# Patient Record
Sex: Female | Born: 1975 | Race: White | Hispanic: No | Marital: Single | Smoking: Former smoker
Health system: Southern US, Community
[De-identification: ages and names within clinical notes are randomized; demographics above are authoritative.]

## PROBLEM LIST (undated history)

## (undated) DIAGNOSIS — E559 Vitamin D deficiency, unspecified: Secondary | ICD-10-CM

## (undated) DIAGNOSIS — F1210 Cannabis abuse, uncomplicated: Secondary | ICD-10-CM

## (undated) DIAGNOSIS — F17200 Nicotine dependence, unspecified, uncomplicated: Secondary | ICD-10-CM

## (undated) DIAGNOSIS — S73015A Posterior dislocation of left hip, initial encounter: Secondary | ICD-10-CM

## (undated) HISTORY — PX: TUBAL LIGATION: SHX77

## (undated) HISTORY — PX: BREAST SURGERY: SHX581

## (undated) HISTORY — PX: FRACTURE SURGERY: SHX138

---
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
## 2004-01-21 ENCOUNTER — Emergency Department (HOSPITAL_COMMUNITY): Admission: EM | Admit: 2004-01-21 | Discharge: 2004-01-21 | Payer: Self-pay | Admitting: Emergency Medicine

## 2004-10-08 ENCOUNTER — Emergency Department (HOSPITAL_COMMUNITY): Admission: EM | Admit: 2004-10-08 | Discharge: 2004-10-08 | Payer: Self-pay | Admitting: Emergency Medicine

## 2005-03-14 ENCOUNTER — Emergency Department (HOSPITAL_COMMUNITY): Admission: EM | Admit: 2005-03-14 | Discharge: 2005-03-14 | Payer: Self-pay | Admitting: Emergency Medicine

## 2005-03-17 ENCOUNTER — Emergency Department (HOSPITAL_COMMUNITY): Admission: EM | Admit: 2005-03-17 | Discharge: 2005-03-17 | Payer: Self-pay | Admitting: Emergency Medicine

## 2005-03-19 ENCOUNTER — Emergency Department (HOSPITAL_COMMUNITY): Admission: EM | Admit: 2005-03-19 | Discharge: 2005-03-19 | Payer: Self-pay | Admitting: Family Medicine

## 2006-01-28 ENCOUNTER — Emergency Department (HOSPITAL_COMMUNITY): Admission: EM | Admit: 2006-01-28 | Discharge: 2006-01-28 | Payer: Self-pay | Admitting: Emergency Medicine

## 2006-08-28 ENCOUNTER — Emergency Department (HOSPITAL_COMMUNITY): Admission: EM | Admit: 2006-08-28 | Discharge: 2006-08-28 | Payer: Self-pay | Admitting: Emergency Medicine

## 2007-06-11 ENCOUNTER — Emergency Department: Payer: Self-pay | Admitting: Emergency Medicine

## 2007-07-17 ENCOUNTER — Emergency Department: Payer: Self-pay | Admitting: Internal Medicine

## 2007-09-25 ENCOUNTER — Emergency Department: Payer: Self-pay | Admitting: Emergency Medicine

## 2008-05-29 ENCOUNTER — Emergency Department: Payer: Self-pay | Admitting: Emergency Medicine

## 2010-09-21 ENCOUNTER — Emergency Department: Payer: Self-pay | Admitting: Emergency Medicine

## 2011-03-29 ENCOUNTER — Emergency Department (HOSPITAL_COMMUNITY)
Admission: EM | Admit: 2011-03-29 | Discharge: 2011-03-29 | Disposition: A | Discharge status: Home / Self Care | Payer: Self-pay | Attending: Emergency Medicine | Admitting: Emergency Medicine

## 2011-03-29 DIAGNOSIS — R109 Unspecified abdominal pain: Secondary | ICD-10-CM | POA: Insufficient documentation

## 2011-03-29 LAB — COMPREHENSIVE METABOLIC PANEL
AST: 9 U/L (ref 0–37)
BUN: 11 mg/dL (ref 6–23)
CO2: 28 mEq/L (ref 19–32)
Calcium: 9.7 mg/dL (ref 8.4–10.5)
Creatinine, Ser: 0.9 mg/dL (ref 0.4–1.2)
GFR calc Af Amer: >60 mL/min (ref >60)
GFR calc non Af Amer: >60 mL/min (ref >60)
Glucose, Bld: 86 mg/dL (ref 70–99)

## 2011-03-29 LAB — URINALYSIS, ROUTINE W REFLEX MICROSCOPIC
Nitrite: NEGATIVE
Protein, ur: NEGATIVE mg/dL
Urobilinogen, UA: 2 mg/dL — ABNORMAL HIGH (ref 0.0–1.0)

## 2011-03-29 LAB — DIFFERENTIAL
Basophils Relative: 1 % (ref 0–1)
Monocytes Absolute: 0.8 10*3/uL (ref 0.1–1.0)
Monocytes Relative: 8 % (ref 3–12)
Neutro Abs: 5.5 10*3/uL (ref 1.7–7.7)

## 2011-03-29 LAB — CBC
Hemoglobin: 11.7 g/dL — ABNORMAL LOW (ref 12.0–15.0)
MCH: 29.9 pg (ref 26.0–34.0)
MCHC: 33.1 g/dL (ref 30.0–36.0)

## 2011-03-29 LAB — URINE MICROSCOPIC-ADD ON

## 2011-03-29 LAB — LIPASE, BLOOD: Lipase: 27 U/L (ref 11–59)

## 2011-03-29 LAB — PREGNANCY, URINE: Preg Test, Ur: NEGATIVE

## 2011-03-30 ENCOUNTER — Ambulatory Visit (HOSPITAL_COMMUNITY)
Admit: 2011-03-30 | Discharge: 2011-03-30 | Disposition: A | Discharge status: Home / Self Care | Payer: Self-pay | Source: Ambulatory Visit | Attending: Emergency Medicine | Admitting: Emergency Medicine

## 2011-03-30 DIAGNOSIS — K824 Cholesterolosis of gallbladder: Secondary | ICD-10-CM | POA: Insufficient documentation

## 2011-03-30 DIAGNOSIS — R109 Unspecified abdominal pain: Secondary | ICD-10-CM | POA: Insufficient documentation

## 2011-03-30 LAB — URINE CULTURE
Colony Count: 35000
Culture  Setup Time: 201205311820

## 2011-08-14 ENCOUNTER — Emergency Department (HOSPITAL_COMMUNITY)
Admission: EM | Admit: 2011-08-14 | Discharge: 2011-08-14 | Disposition: A | Discharge status: Home / Self Care | Payer: Self-pay | Attending: Emergency Medicine | Admitting: Emergency Medicine

## 2011-08-14 ENCOUNTER — Encounter: Payer: Self-pay | Admitting: *Deleted

## 2011-08-14 ENCOUNTER — Emergency Department (HOSPITAL_COMMUNITY): Payer: Self-pay

## 2011-08-14 DIAGNOSIS — F17200 Nicotine dependence, unspecified, uncomplicated: Secondary | ICD-10-CM | POA: Insufficient documentation

## 2011-08-14 DIAGNOSIS — Y92009 Unspecified place in unspecified non-institutional (private) residence as the place of occurrence of the external cause: Secondary | ICD-10-CM | POA: Insufficient documentation

## 2011-08-14 DIAGNOSIS — M65839 Other synovitis and tenosynovitis, unspecified forearm: Secondary | ICD-10-CM | POA: Insufficient documentation

## 2011-08-14 DIAGNOSIS — X503XXA Overexertion from repetitive movements, initial encounter: Secondary | ICD-10-CM | POA: Insufficient documentation

## 2011-08-14 DIAGNOSIS — M65849 Other synovitis and tenosynovitis, unspecified hand: Secondary | ICD-10-CM | POA: Insufficient documentation

## 2011-08-14 DIAGNOSIS — M779 Enthesopathy, unspecified: Secondary | ICD-10-CM

## 2011-08-14 LAB — URINALYSIS, ROUTINE W REFLEX MICROSCOPIC
Hgb urine dipstick: NEGATIVE
Ketones, ur: NEGATIVE mg/dL
Protein, ur: NEGATIVE mg/dL
Urobilinogen, UA: 0.2 mg/dL (ref 0.0–1.0)

## 2011-08-14 LAB — URINE MICROSCOPIC-ADD ON

## 2011-08-14 MED ORDER — DEXAMETHASONE SODIUM PHOSPHATE 4 MG/ML IJ SOLN
8.0000 mg | Freq: Once | INTRAMUSCULAR | Status: AC
  Administered 2011-08-14: 8 mg via INTRAMUSCULAR
  Filled 2011-08-14: qty 2

## 2011-08-14 MED ORDER — HYDROCODONE-ACETAMINOPHEN 5-325 MG PO TABS: 1.0000 | ORAL_TABLET | ORAL | Status: AC | PRN

## 2011-08-14 MED ORDER — DEXAMETHASONE 6 MG PO TABS: ORAL_TABLET | ORAL | Status: AC

## 2011-08-14 NOTE — ED Notes (Signed)
Pt states injured arm while working in yard on Sunday. Pt thought it would get better however the "swelling has increased".  Pt also c/o burning and frequency with urination. Urine specimen obtained and sent.

## 2011-08-14 NOTE — ED Provider Notes (Signed)
History     CSN: [id number] Arrival date & time: 08/14/2011 12:24 PM   First MD Initiated Contact with Patient 08/14/11 1324      Chief Complaint  Patient presents with  . Arm Injury    (Consider location/radiation/quality/duration/timing/severity/associated sxs/prior treatment) Patient is a 35 y.o. female presenting with arm injury. The history is provided by the patient.  Arm Injury  The incident occurred yesterday. The incident occurred at home. Injury mechanism: lifting and repetitive motion. Context: working in the yard. There is an injury to the right wrist. The pain is moderate. It is unlikely that a foreign body is present. Pertinent negatives include no chest pain, no numbness, no abdominal pain, no bowel incontinence, no nausea, no vomiting, no neck pain, no focal weakness, no seizures, no weakness and no cough. There have been no prior injuries to these areas. She is right-handed. She has been behaving normally. She has received no recent medical care.    History reviewed. No pertinent past medical history.  Past Surgical History  Procedure Date  . Tubal ligation     Family History  Problem Relation Age of Onset  . Diabetes Other     History  Substance Use Topics  . Smoking status: Current Everyday Smoker -- 1.0 packs/day    Types: Cigarettes  . Smokeless tobacco: Not on file  . Alcohol Use: Yes     occasional social drinker    OB History    Grav Para Term Preterm Abortions TAB SAB Ect Mult Living                  Review of Systems  Constitutional: Negative for activity change.       All ROS Neg except as noted in HPI  HENT: Negative for nosebleeds and neck pain.   Eyes: Negative for photophobia and discharge.  Respiratory: Negative for cough, shortness of breath and wheezing.   Cardiovascular: Negative for chest pain and palpitations.  Gastrointestinal: Negative for nausea, vomiting, abdominal pain, blood in stool and bowel incontinence.    Genitourinary: Negative for dysuria, frequency and hematuria.  Musculoskeletal: Negative for back pain and arthralgias.  Skin: Negative.   Neurological: Negative for dizziness, focal weakness, seizures, speech difficulty, weakness and numbness.  Psychiatric/Behavioral: Negative for hallucinations and confusion.    Allergies  Aspirin  Home Medications   Current Outpatient Rx  Name Route Sig Dispense Refill  . DEXAMETHASONE 6 MG PO TABS  1 PO BID WITH FOOD 12 tablet 0  . HYDROCODONE-ACETAMINOPHEN 5-325 MG PO TABS Oral Take 1 tablet by mouth every 4 (four) hours as needed for pain. 15 tablet 0    BP 107/71  Pulse 85  Temp(Src) 97.7 F (36.5 C) (Oral)  Resp 17  Ht 5\' 3"  (1.6 m)  Wt 197 lb 3.2 oz (89.449 kg)  BMI 34.93 kg/m2  SpO2 100%  LMP 07/30/2011  Physical Exam  Nursing note and vitals reviewed. Constitutional: She is oriented to person, place, and time. She appears well-developed and well-nourished.  Non-toxic appearance.  HENT:  Head: Normocephalic.  Right Ear: Tympanic membrane and external ear normal.  Left Ear: Tympanic membrane and external ear normal.  Eyes: EOM and lids are normal. Pupils are equal, round, and reactive to light.  Neck: Normal range of motion. Neck supple. Carotid bruit is not present.  Cardiovascular: Normal rate, regular rhythm, normal heart sounds, intact distal pulses and normal pulses.   Pulmonary/Chest: Breath sounds normal. No respiratory distress.  Abdominal: Soft. Bowel sounds  are normal. There is no tenderness. There is no guarding.  Musculoskeletal: She exhibits tenderness.       Pain of the rt wrist with mild to mod swelling.  Decrease ROM.  Crepitus with movement of the wrist. Tender to palpation right wrist.  Lymphadenopathy:       Head (right side): No submandibular adenopathy present.       Head (left side): No submandibular adenopathy present.    She has no cervical adenopathy.  Neurological: She is alert and oriented to  person, place, and time. She has normal strength. No cranial nerve deficit or sensory deficit.  Skin: Skin is warm and dry.  Psychiatric: She has a normal mood and affect. Her speech is normal.    ED Course  Procedures (including critical care time)  Labs Reviewed  URINALYSIS, ROUTINE W REFLEX MICROSCOPIC - Abnormal; Notable for the following:    Specific Gravity, Urine >1.030 (*)    Leukocytes, UA SMALL (*)    All other components within normal limits  URINE MICROSCOPIC-ADD ON - Abnormal; Notable for the following:    Squamous Epithelial / LPF MANY (*)    Bacteria, UA FEW (*)    All other components within normal limits   Dg Wrist Complete Right  08/14/2011  *RADIOLOGY REPORT*  Clinical Data: Pain and swelling post fall  RIGHT WRIST - COMPLETE 3+ VIEW  Comparison: None  Findings: Bone mineralization normal. Joint spaces preserved. No fracture, dislocation, or bone destruction.  IMPRESSION: Normal exam.  Original Report Authenticated By: Lollie Marrow, M.D.     1. Tendonitis       MDM  I have reviewed nursing notes, vital signs, and all appropriate lab and imaging results for this patient.        Kathie Dike, [place] 08/21/11 [phone]

## 2011-08-14 NOTE — ED Notes (Signed)
Pt states was picking up debris in yard when she "felt a pop in wrist". Pt also c/o lower left abdominal pain with urinary frequency and burning.

## 2011-08-23 NOTE — ED Provider Notes (Signed)
Medical screening examination/treatment/procedure(s) were performed by non-physician practitioner and as supervising physician I was immediately available for consultation/collaboration.   Devanie Galanti M Teya Otterson, DO 08/23/11 1718 

## 2012-01-20 ENCOUNTER — Encounter (HOSPITAL_COMMUNITY): Payer: Self-pay | Admitting: *Deleted

## 2012-01-20 ENCOUNTER — Other Ambulatory Visit: Payer: Self-pay

## 2012-01-20 ENCOUNTER — Emergency Department (HOSPITAL_COMMUNITY)
Admission: EM | Admit: 2012-01-20 | Discharge: 2012-01-20 | Disposition: A | Discharge status: Home / Self Care | Payer: Self-pay | Attending: Emergency Medicine | Admitting: Emergency Medicine

## 2012-01-20 DIAGNOSIS — F1010 Alcohol abuse, uncomplicated: Secondary | ICD-10-CM | POA: Insufficient documentation

## 2012-01-20 DIAGNOSIS — F17200 Nicotine dependence, unspecified, uncomplicated: Secondary | ICD-10-CM | POA: Insufficient documentation

## 2012-01-20 DIAGNOSIS — F10929 Alcohol use, unspecified with intoxication, unspecified: Secondary | ICD-10-CM

## 2012-01-20 DIAGNOSIS — F1910 Other psychoactive substance abuse, uncomplicated: Secondary | ICD-10-CM | POA: Insufficient documentation

## 2012-01-20 LAB — URINALYSIS, ROUTINE W REFLEX MICROSCOPIC
Glucose, UA: NEGATIVE mg/dL
Ketones, ur: NEGATIVE mg/dL
Leukocytes, UA: NEGATIVE
Nitrite: NEGATIVE
Specific Gravity, Urine: <1.005 — ABNORMAL LOW (ref 1.005–1.030)
pH: 6 (ref 5.0–8.0)

## 2012-01-20 LAB — DIFFERENTIAL
Basophils Relative: 1 % (ref 0–1)
Monocytes Absolute: 0.6 10*3/uL (ref 0.1–1.0)
Monocytes Relative: 6 % (ref 3–12)
Neutro Abs: 6 10*3/uL (ref 1.7–7.7)

## 2012-01-20 LAB — RAPID URINE DRUG SCREEN, HOSP PERFORMED
Amphetamines: NOT DETECTED
Benzodiazepines: NOT DETECTED
Opiates: NOT DETECTED

## 2012-01-20 LAB — BASIC METABOLIC PANEL
BUN: 5 mg/dL — ABNORMAL LOW (ref 6–23)
Chloride: 102 mEq/L (ref 96–112)
Creatinine, Ser: 0.97 mg/dL (ref 0.50–1.10)
GFR calc Af Amer: 85 mL/min — ABNORMAL LOW (ref >90)
Glucose, Bld: 81 mg/dL (ref 70–99)

## 2012-01-20 LAB — CBC
HCT: 41.6 % (ref 36.0–46.0)
Hemoglobin: 13.8 g/dL (ref 12.0–15.0)
MCH: 29.9 pg (ref 26.0–34.0)
MCHC: 33.2 g/dL (ref 30.0–36.0)

## 2012-01-20 MED ORDER — NALOXONE HCL 0.4 MG/ML IJ SOLN
INTRAMUSCULAR | Status: AC
  Administered 2012-01-20: 0.4 mg
  Filled 2012-01-20: qty 1

## 2012-01-20 MED ORDER — ONDANSETRON HCL 4 MG/2ML IJ SOLN
4.0000 mg | Freq: Once | INTRAMUSCULAR | Status: AC
  Administered 2012-01-20: 4 mg via INTRAVENOUS
  Filled 2012-01-20: qty 2

## 2012-01-20 MED ORDER — PANTOPRAZOLE SODIUM 40 MG IV SOLR
40.0000 mg | Freq: Once | INTRAVENOUS | Status: AC
  Administered 2012-01-20: 40 mg via INTRAVENOUS
  Filled 2012-01-20: qty 40

## 2012-01-20 MED ORDER — SODIUM CHLORIDE 0.9 % IV BOLUS (SEPSIS)
1000.0000 mL | Freq: Once | INTRAVENOUS | Status: AC
  Administered 2012-01-20: 1000 mL via INTRAVENOUS
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
## 2012-01-20 NOTE — ED Notes (Signed)
ems called for pt. Has been drinking, using crack tonight.

## 2012-01-20 NOTE — ED Notes (Signed)
Patient stating, "please let me go. Just let me die." witnessed by Tenna Delaine, RN. Patient repeating requests.
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
## 2012-01-20 NOTE — Discharge Instructions (Signed)
 RESOURCE GUIDE  Dental Problems  Patients with Medicaid: [facility]                     [facility] [address]                                           [address] Phone:  [phone]                                                  Phone:  [phone]  If unable to pay or uninsured, contact:  [facility] or [facility] to become qualified for the adult dental clinic.  Chronic Pain Problems Contact [facility]  [phone] Patients need to be referred by their primary care doctor.  Insufficient Money for Medicine Contact [employer]:  call "211" or [facility] 271-5999.  No Primary Care Doctor Call [facility]  [phone] Other agencies that provide inexpensive medical care    [facility]  [phone]    [facility]  [phone]    [facility]  [phone]    [facility]  [phone]    [facility]  [phone]    [facility]  [phone]  Psychological Services [facility]  [phone] [facility]  [phone] [facility]   [phone] (emergency services [phone])  Substance Abuse Resources [facility]  336-882-2125 [facility] 336-784-9470 [facility] 336-285-9073 [facility] 336-845-3988 Residential & Outpatient Substance Abuse Program  800-659-3381  Abuse/Neglect [place] Child Abuse Hotline (336) 641-3795 [place] Child Abuse Hotline 800-378-5315 (After Hours)  Emergency Shelter [facility] (336) 271-5985  [facility] (336) 275-9566 [facility] (704) 372-4663  MRSA Hotline #:   832-7006    [place] [facility]  [facility] [place]     [employer]                          [facility] [address] [place]                       [address]      [address]  [place]                                                [place]                            [place] Phone:  [phone]                                   Phone:  [phone]                 Phone:  [phone]  [facility] Phone:    [phone]  [place] Child Abuse Hotline (336) 342-1394 (336) 342-3537 (After Hours)   

## 2012-01-20 NOTE — ED Notes (Signed)
Foley catheter removed and tip was intact. Pt tolerated well.
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
## 2012-02-23 ENCOUNTER — Encounter (HOSPITAL_COMMUNITY): Payer: Self-pay | Admitting: *Deleted

## 2012-02-23 ENCOUNTER — Emergency Department (HOSPITAL_COMMUNITY)
Admission: EM | Admit: 2012-02-23 | Discharge: 2012-02-23 | Disposition: A | Discharge status: Home / Self Care | Payer: Self-pay | Attending: Emergency Medicine | Admitting: Emergency Medicine

## 2012-02-23 ENCOUNTER — Emergency Department (HOSPITAL_COMMUNITY): Payer: Self-pay

## 2012-02-23 DIAGNOSIS — IMO0002 Reserved for concepts with insufficient information to code with codable children: Secondary | ICD-10-CM

## 2012-02-23 DIAGNOSIS — W1809XA Striking against other object with subsequent fall, initial encounter: Secondary | ICD-10-CM | POA: Insufficient documentation

## 2012-02-23 DIAGNOSIS — S40022A Contusion of left upper arm, initial encounter: Secondary | ICD-10-CM

## 2012-02-23 DIAGNOSIS — S40029A Contusion of unspecified upper arm, initial encounter: Secondary | ICD-10-CM | POA: Insufficient documentation

## 2012-02-23 DIAGNOSIS — R51 Headache: Secondary | ICD-10-CM | POA: Insufficient documentation

## 2012-02-23 DIAGNOSIS — S0100XA Unspecified open wound of scalp, initial encounter: Secondary | ICD-10-CM | POA: Insufficient documentation

## 2012-02-23 MED ORDER — IBUPROFEN 600 MG PO TABS: 600.0000 mg | ORAL_TABLET | Freq: Four times a day (QID) | ORAL | Status: AC | PRN

## 2012-02-23 MED ORDER — TETANUS-DIPHTH-ACELL PERTUSSIS 5-2.5-18.5 LF-MCG/0.5 IM SUSP
0.5000 mL | Freq: Once | INTRAMUSCULAR | Status: AC
  Administered 2012-02-23: 0.5 mL via INTRAMUSCULAR
  Filled 2012-02-23: qty 0.5

## 2012-02-23 NOTE — ED Notes (Signed)
Pt sleeping and hard to arouse. With tactile stimulation patient will open eyes and respond. Pt able to state her name but immediately goes back to sleep.
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
## 2012-02-23 NOTE — ED Notes (Signed)
Pt awake and able to ambulate and answer questions appropriately. Pt discharged to care of [facility] for safe home check.
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
## 2012-02-23 NOTE — Discharge Instructions (Signed)
Contusion A contusion is a deep bruise. Contusions are the result of an injury that caused bleeding under the skin. The contusion may turn blue, purple, or yellow. Minor injuries will give you a painless contusion, but more severe contusions may stay painful and swollen for a few weeks.  CAUSES  A contusion is usually caused by a blow, trauma, or direct force to an area of the body. SYMPTOMS   Swelling and redness of the injured area.   Bruising of the injured area.   Tenderness and soreness of the injured area.   Pain.  DIAGNOSIS  The diagnosis can be made by taking a history and physical exam. An X-ray, CT scan, or MRI may be needed to determine if there were any associated injuries, such as fractures. TREATMENT  Specific treatment will depend on what area of the body was injured. In general, the best treatment for a contusion is resting, icing, elevating, and applying cold compresses to the injured area. Over-the-counter medicines may also be recommended for pain control. Ask your caregiver what the best treatment is for your contusion. HOME CARE INSTRUCTIONS   Put ice on the injured area.   Put ice in a plastic bag.   Place a towel between your skin and the bag.   Leave the ice on for 15 to 20 minutes, 3 to 4 times a day.   Only take over-the-counter or prescription medicines for pain, discomfort, or fever as directed by your caregiver. Your caregiver may recommend avoiding anti-inflammatory medicines (aspirin, ibuprofen, and naproxen) for 48 hours because these medicines may increase bruising.   Rest the injured area.   If possible, elevate the injured area to reduce swelling.  SEEK IMMEDIATE MEDICAL CARE IF:   You have increased bruising or swelling.   You have pain that is getting worse.   Your swelling or pain is not relieved with medicines.  MAKE SURE YOU:   Understand these instructions.   Will watch your condition.   RESOURCE GUIDE  Dental  Problems  Patients with Medicaid: [facility] [phone] [address]                                           [phone] [address] Phone:  [phone]                                                  Phone:  [phone]  If unable to pay or uninsured, contact:  [facility] or [facility]. to become qualified for the adult dental clinic.  Chronic Pain Problems Contact [facility]  [phone] Patients need to be referred by their primary care doctor.  Insufficient Money for Medicine Contact [employer]:  call "211" or [facility] [phone]) 035-5166.  No Primary Care Doctor Call [facility]  [phone] Other agencies that provide inexpensive medical care    [facility]  [phone]    [facility]  [phone]    [facility]  [phone]    [facility]  [phone]    [facility]  [phone]  [facility]  [phone]  Psychological Services [facility]  [phone] [facility]  [phone] [facility]   [phone] (emergency services [phone])  Substance Abuse Resources [facility]  [phone] [facility] [phone] [facility] [place] [phone] Floydene Flock [phone] Residential & Outpatient Substance Abuse Program  [phone]  Abuse/Neglect [facility] Child Abuse Hotline 667-416-5421 [facility] Child Abuse Hotline 250-871-4741 (After Hours)  Emergency Shelter [facility] [phone]  Maternity Homes [facility] at [facility] [place] [facility] [place] [phone] [place] Alert Services [phone]  MRSA Hotline #:   (403)005-2312    [facility] Resources  [facility]     [employer]                          [facility] [address] [place]                       [address]      [address]   Blondell Reveal Phone:  [phone]                                   Phone:  [phone]                 Phone:  [phone]  [facility] Phone:  [phone]  [facility] Child Abuse Hotline 318-483-0725 [phone] (After Hours)

## 2012-02-23 NOTE — ED Notes (Signed)
Pt sleeping. 

## 2012-02-23 NOTE — ED Notes (Signed)
Family at bedside. 

## 2012-02-23 NOTE — ED Notes (Signed)
Dr. Dierdre Highman in with pt and placed 2 staples in the back of her head.

## 2012-02-23 NOTE — ED Notes (Signed)
Lawson Fiscal, RN and [place], [place] went to ct with pt.

## 2012-02-23 NOTE — ED Notes (Signed)
Pt arouses easier to verbal stimuli but states that she is tired. Pt falls back to sleep with no verbal or tactile stimulation. [facility] PD notified that patient needs a ride home and that we need to make sure she is safe. They will send an officer to talk with her. Pt dressed and sleeping at this time.

## 2012-05-05 ENCOUNTER — Emergency Department (HOSPITAL_COMMUNITY)
Admission: EM | Admit: 2012-05-05 | Discharge: 2012-05-05 | Disposition: A | Discharge status: Home / Self Care | Payer: Self-pay | Attending: Emergency Medicine | Admitting: Emergency Medicine

## 2012-05-05 ENCOUNTER — Encounter (HOSPITAL_COMMUNITY): Payer: Self-pay | Admitting: Emergency Medicine

## 2012-05-05 DIAGNOSIS — N390 Urinary tract infection, site not specified: Secondary | ICD-10-CM | POA: Insufficient documentation

## 2012-05-05 DIAGNOSIS — F17200 Nicotine dependence, unspecified, uncomplicated: Secondary | ICD-10-CM | POA: Insufficient documentation

## 2012-05-05 LAB — URINE MICROSCOPIC-ADD ON

## 2012-05-05 LAB — URINALYSIS, ROUTINE W REFLEX MICROSCOPIC
Glucose, UA: NEGATIVE mg/dL
Protein, ur: 100 mg/dL — AB
Specific Gravity, Urine: >1.03 — ABNORMAL HIGH (ref 1.005–1.030)
pH: 6.5 (ref 5.0–8.0)

## 2012-05-05 LAB — PREGNANCY, URINE: Preg Test, Ur: NEGATIVE

## 2012-05-05 MED ORDER — CIPROFLOXACIN HCL 250 MG PO TABS
500.0000 mg | ORAL_TABLET | Freq: Once | ORAL | Status: AC
  Administered 2012-05-05: 500 mg via ORAL
  Filled 2012-05-05: qty 2

## 2012-05-05 MED ORDER — PHENAZOPYRIDINE HCL 100 MG PO TABS
200.0000 mg | ORAL_TABLET | Freq: Once | ORAL | Status: AC
  Administered 2012-05-05: 200 mg via ORAL
  Filled 2012-05-05: qty 2

## 2012-05-05 MED ORDER — HYDROCODONE-ACETAMINOPHEN 5-500 MG PO TABS
1.0000 | ORAL_TABLET | Freq: Four times a day (QID) | ORAL | Status: AC | PRN
Start: 2012-05-05 — End: 2012-05-15

## 2012-05-05 MED ORDER — HYDROCODONE-ACETAMINOPHEN 5-325 MG PO TABS
2.0000 | ORAL_TABLET | Freq: Once | ORAL | Status: AC
  Administered 2012-05-05: 2 via ORAL
  Filled 2012-05-05: qty 2

## 2012-05-05 MED ORDER — CIPROFLOXACIN HCL 500 MG PO TABS: 500.0000 mg | ORAL_TABLET | Freq: Two times a day (BID) | ORAL | Status: AC

## 2012-05-05 MED ORDER — PHENAZOPYRIDINE HCL 200 MG PO TABS: 200.0000 mg | ORAL_TABLET | Freq: Three times a day (TID) | ORAL | Status: AC

## 2012-05-05 NOTE — ED Notes (Signed)
Patient with c/o lower abdominal pain/back pain x 4 days. Burning with urination, blood in urine.

## 2012-05-05 NOTE — ED Notes (Signed)
Patient with no complaints at this time. Respirations even and unlabored. Skin warm/dry. Discharge instructions reviewed with patient at this time. Patient given opportunity to voice concerns/ask questions. Patient discharged at this time and left Emergency Department with steady gait.   

## 2012-05-05 NOTE — ED Notes (Signed)
Patient ambulatory to restroom with steady gait to give urine specimen.

## 2012-05-05 NOTE — ED Provider Notes (Addendum)
History       CSN: [id number]  Arrival date & time 05/05/12  1356   First MD Initiated Contact with Patient 05/05/12 1424      Chief Complaint  Patient presents with  . Abdominal Pain  . Back Pain    (Consider location/radiation/quality/duration/timing/severity/associated sxs/prior treatment) The history is provided by the patient.    Marie Barnes is a 36 y.o. female who presents to the Emergency Department complaining of  Pt c/o dysuria for past 3-4 days, episodic, w each time that she urinates. . Now c/o suprapubic pain, dull/constant/nonradiating, and right flank pain. Denies hematuria. No vaginal discharge or blleeding.  Denies hx uti/pyelonephritis. No hx kidney stones. Hx tubal ligation, denies other surgery. No fever or chills. Normal appetite. No nvd. No constipation.      History reviewed. No pertinent past medical history.  Past Surgical History  Procedure Date  . Tubal ligation     Family History  Problem Relation Age of Onset  . Diabetes Other     History  Substance Use Topics  . Smoking status: Current Everyday Smoker -- 1.0 packs/day    Types: Cigarettes  . Smokeless tobacco: Not on file  . Alcohol Use: Yes     occasional social drinker    OB History    Grav Para Term Preterm Abortions TAB SAB Ect Mult Living                  Review of Systems  Constitutional: Negative for fever and chills.  HENT: Negative for neck pain.   Eyes: Negative for pain.  Respiratory: Negative for shortness of breath.   Cardiovascular: Negative for chest pain.  Gastrointestinal: Negative for vomiting and diarrhea.  Genitourinary: Positive for flank pain.  Musculoskeletal: Positive for back pain.  Skin: Negative for rash.  Neurological: Negative for headaches.  Hematological: Does not bruise/bleed easily.    Allergies  Aspirin and Penicillins  Home Medications  No current outpatient prescriptions on file.  BP 120/66  Pulse 85  Temp 98.9 F (37.2  C) (Oral)  Resp 20  Ht 5\' 4"  (1.626 m)  Wt 160 lb (72.576 kg)  BMI 27.46 kg/m2  SpO2 100%  Physical Exam  Nursing note and vitals reviewed. Constitutional: She appears well-developed and well-nourished. No distress.  HENT:  Head: Atraumatic.  Mouth/Throat: Oropharynx is clear and moist.  Eyes: Conjunctivae are normal. No scleral icterus.  Neck: Neck supple. No tracheal deviation present.  Cardiovascular: Normal rate.   Pulmonary/Chest: Effort normal. No respiratory distress.  Abdominal: Soft. Normal appearance and bowel sounds are normal. She exhibits no distension and no mass. There is no tenderness. There is no rebound and no guarding.  Genitourinary:       No cva tenderness.   Musculoskeletal: She exhibits no edema.       tls spine nontender.   Neurological: She is alert.  Skin: Skin is warm and dry. No rash noted.  Psychiatric: She has a normal mood and affect.    ED Course  Procedures (including critical care time)  DIAGNOSTIC STUDIES: Oxygen Saturation is 100% on room air, normal by my interpretation.    COORDINATION OF CARE:     Labs Reviewed  URINALYSIS, ROUTINE W REFLEX MICROSCOPIC   Results for orders placed during the hospital encounter of 05/05/12  URINALYSIS, ROUTINE W REFLEX MICROSCOPIC      Component Value Range   Color, Urine AMBER (*) YELLOW   APPearance HAZY (*) CLEAR   Specific Gravity, Urine >  1.030 (*) 1.005 - 1.030   pH 6.5  5.0 - 8.0   Glucose, UA NEGATIVE  NEGATIVE mg/dL   Hgb urine dipstick LARGE (*) NEGATIVE   Bilirubin Urine SMALL (*) NEGATIVE   Ketones, ur NEGATIVE  NEGATIVE mg/dL   Protein, ur 454 (*) NEGATIVE mg/dL   Urobilinogen, UA 1.0  0.0 - 1.0 mg/dL   Nitrite NEGATIVE  NEGATIVE   Leukocytes, UA MODERATE (*) NEGATIVE  PREGNANCY, URINE      Component Value Range   Preg Test, Ur NEGATIVE  NEGATIVE  URINE MICROSCOPIC-ADD ON      Component Value Range   Squamous Epithelial / LPF RARE  RARE   WBC, UA TOO NUMEROUS TO COUNT  <3  WBC/hpf   RBC / HPF 11-20  <3 RBC/hpf   Bacteria, UA FEW (*) RARE   Crystals CA OXALATE CRYSTALS (*) NEGATIVE      MDM  Labs.  Pt says took ems here, does not have to drive home. vicodin po.   ua w tntc wbc, le pos.   Allergy to pc, no other abx allergies. cipro po.   As feeling some pain in back/right flank, although no cva tenderness or fever, ?early pyelo, therefore will tx for 1 week.     Suzi Roots, MD 05/05/12 1510  Suzi Roots, MD 05/05/12 [phone]

## 2012-06-01 ENCOUNTER — Emergency Department (HOSPITAL_COMMUNITY)
Admission: EM | Admit: 2012-06-01 | Discharge: 2012-06-01 | Disposition: A | Discharge status: Home / Self Care | Payer: Self-pay | Attending: Emergency Medicine | Admitting: Emergency Medicine

## 2012-06-01 ENCOUNTER — Encounter (HOSPITAL_COMMUNITY): Payer: Self-pay | Admitting: *Deleted

## 2012-06-01 DIAGNOSIS — A499 Bacterial infection, unspecified: Secondary | ICD-10-CM | POA: Insufficient documentation

## 2012-06-01 DIAGNOSIS — B9689 Other specified bacterial agents as the cause of diseases classified elsewhere: Secondary | ICD-10-CM | POA: Insufficient documentation

## 2012-06-01 DIAGNOSIS — N841 Polyp of cervix uteri: Secondary | ICD-10-CM

## 2012-06-01 DIAGNOSIS — N760 Acute vaginitis: Secondary | ICD-10-CM | POA: Insufficient documentation

## 2012-06-01 DIAGNOSIS — F17200 Nicotine dependence, unspecified, uncomplicated: Secondary | ICD-10-CM | POA: Insufficient documentation

## 2012-06-01 LAB — WET PREP, GENITAL
Trich, Wet Prep: NONE SEEN
Yeast Wet Prep HPF POC: NONE SEEN

## 2012-06-01 LAB — URINALYSIS, ROUTINE W REFLEX MICROSCOPIC
Bilirubin Urine: NEGATIVE
Hgb urine dipstick: NEGATIVE
Ketones, ur: NEGATIVE mg/dL
Protein, ur: NEGATIVE mg/dL
Urobilinogen, UA: 0.2 mg/dL (ref 0.0–1.0)

## 2012-06-01 MED ORDER — METRONIDAZOLE 500 MG PO TABS: 500.0000 mg | ORAL_TABLET | Freq: Two times a day (BID) | ORAL | Status: AC

## 2012-06-01 NOTE — ED Notes (Signed)
Up to bathroom to collect specimen. No visualized prolapse on return to bed.

## 2012-06-01 NOTE — ED Provider Notes (Signed)
History     CSN: [id number]  Arrival date & time 06/01/12  1256   First MD Initiated Contact with Patient 06/01/12 1334      Chief Complaint  Patient presents with  . Abdominal Pain  . Vaginal Pain    HPI Pt was seen at 1400.  Per pt, c/o sudden onset and persistence of intermittent episodes of "feeling like something fell out of my vagina" last night and again today while in shower.  Has been associated with lower abd "soreness" and "cramping."  Denies vaginal bleeding/discharge, no dysuria, no back pain, no N/V/D, no fevers, no trauma.     History reviewed. No pertinent past medical history.  Past Surgical History  Procedure Date  . Tubal ligation     Family History  Problem Relation Age of Onset  . Diabetes Other     History  Substance Use Topics  . Smoking status: Current Everyday Smoker -- 1.0 packs/day    Types: Cigarettes  . Smokeless tobacco: Not on file  . Alcohol Use: Yes     occasional social drinker    Review of Systems ROS: Statement: All systems negative except as marked or noted in the HPI; Constitutional: Negative for fever and chills. ; ; Eyes: Negative for eye pain, redness and discharge. ; ; ENMT: Negative for ear pain, hoarseness, nasal congestion, sinus pressure and sore throat. ; ; Cardiovascular: Negative for chest pain, palpitations, diaphoresis, dyspnea and peripheral edema. ; ; Respiratory: Negative for cough, wheezing and stridor. ; ; Gastrointestinal: Negative for nausea, vomiting, diarrhea, abdominal pain, blood in stool, hematemesis, jaundice and rectal bleeding. . ; ; Genitourinary: Negative for dysuria, flank pain and hematuria. ; ; GYN:  +prolapse from vagina.  No vaginal bleeding, no vaginal discharge, no vulvar pain. ;; Musculoskeletal: Negative for back pain and neck pain. Negative for swelling and trauma.; ; Skin: Negative for pruritus, rash, abrasions, blisters, bruising and skin lesion.; ; Neuro: Negative for headache, lightheadedness and  neck stiffness. Negative for weakness, altered level of consciousness , altered mental status, extremity weakness, paresthesias, involuntary movement, seizure and syncope.     Allergies  Aspirin and Penicillins  Home Medications  No current outpatient prescriptions on file.  BP 113/64  Pulse 82  Temp 98.5 F (36.9 C) (Oral)  Resp 16  Ht 5\' 4"  (1.626 m)  Wt 145 lb (65.772 kg)  BMI 24.89 kg/m2  SpO2 99%  LMP 05/17/2012  Physical Exam 1405: Physical examination:  Nursing notes reviewed; Vital signs and O2 SAT reviewed;  Constitutional: Well developed, Well nourished, Well hydrated, In no acute distress; Head:  Normocephalic, atraumatic; Eyes: EOMI, PERRL, No scleral icterus; ENMT: Mouth and pharynx normal, Mucous membranes moist; Neck: Supple, Full range of motion, No lymphadenopathy; Cardiovascular: Regular rate and rhythm, No murmur, rub, or gallop; Respiratory: Breath sounds clear & equal bilaterally, No rales, rhonchi, wheezes.  Speaking full sentences with ease, Normal respiratory effort/excursion; Chest: Nontender, Movement normal; Abdomen: Soft, Nontender, Nondistended, Normal bowel sounds; Genitourinary: No CVA tenderness; Pelvic exam performed with permission of pt and female ED tech assist during exam.  External genitalia w/o lesions. Vaginal vault without discharge.  Cervix with approx 2-3cm diameter round polyp/cystic lesion obscuring os area and appears to originate from cervix or os, does not feel attached to vaginal wall. Lesion is not friable, no active bleeding, no visualized ulcerations.  The cervix that is visualized is not friable, GC/chlam and wet prep obtained and sent to lab.  Bimanual exam w/o CMT or  adnexal tenderness, +mild suprapubic tenderness to palp.;; Extremities: Pulses normal, No tenderness, No edema, No calf edema or asymmetry.; Neuro: AA&Ox3, Major CN grossly intact.  Speech clear. No gross focal motor or sensory deficits in extremities.; Skin: Color normal,  Warm, Dry.   ED Course  Procedures   MDM  MDM Reviewed: nursing note and vitals   Results for orders placed during the hospital encounter of 06/01/12  URINALYSIS, ROUTINE W REFLEX MICROSCOPIC      Component Value Range   Color, Urine YELLOW  YELLOW   APPearance CLEAR  CLEAR   Specific Gravity, Urine 1.015  1.005 - 1.030   pH 7.0  5.0 - 8.0   Glucose, UA NEGATIVE  NEGATIVE mg/dL   Hgb urine dipstick NEGATIVE  NEGATIVE   Bilirubin Urine NEGATIVE  NEGATIVE   Ketones, ur NEGATIVE  NEGATIVE mg/dL   Protein, ur NEGATIVE  NEGATIVE mg/dL   Urobilinogen, UA 0.2  0.0 - 1.0 mg/dL   Nitrite NEGATIVE  NEGATIVE   Leukocytes, UA NEGATIVE  NEGATIVE  PREGNANCY, URINE      Component Value Range   Preg Test, Ur NEGATIVE  NEGATIVE  WET PREP, GENITAL      Component Value Range   Yeast Wet Prep HPF POC NONE SEEN  NONE SEEN   Trich, Wet Prep NONE SEEN  NONE SEEN   Clue Cells Wet Prep HPF POC FEW (*) NONE SEEN   WBC, Wet Prep HPF POC MODERATE (*) NONE SEEN      1415:  T/C to OB/GYN Dr. Erin Fulling, case discussed, including:  HPI, pertinent PM/SHx, VS/PE, dx testing, ED course and treatment:  lesion is likely either fibroid from uterus that has prolapsed though os, cervical prolapse itself, or cervical polyp; regardless, pt does not need emergent imaging study today ([place] or CT scan), can f/u in clinic this week.  Will tx for BV, GC/chlam pending.  Dx testing, as well as d/w OB/GYN, d/w pt.  Questions answered.  Verb understanding, agreeable to d/c home with outpt f/u.        Laray Anger, DO 06/02/12 1536

## 2012-06-01 NOTE — ED Notes (Signed)
Notice something "sticking out from vagina while taking shower last night" at 2130. Pt states whatever it is goes back in when she lays down. Unable to visualize any abnormalities upon arrival to ED. Pt states lower abdominal cramping, lower back pain and leg pain since last night. NAD.

## 2014-06-30 ENCOUNTER — Emergency Department: Payer: Self-pay | Admitting: Student

## 2014-07-20 ENCOUNTER — Ambulatory Visit: Payer: Self-pay | Admitting: Orthopedic Surgery

## 2014-10-14 ENCOUNTER — Emergency Department: Payer: Self-pay | Admitting: Emergency Medicine

## 2015-02-19 NOTE — Op Note (Signed)
PATIENT NAME:  Marie Barnes, Marie Barnes MR#:  [id number] DATE OF BIRTH:  Dec 03, 1975  DATE OF PROCEDURE:  07/20/2014  PREOPERATIVE DIAGNOSIS: Displaced distal left fifth metacarpal shaft fracture.   POSTOPERATIVE DIAGNOSIS: Displaced distal left fifth metacarpal shaft fracture.   PROCEDURE: Open reduction and internal fixation, left distal fifth metacarpal.   ANESTHESIA: General.   SURGEON: Laurene Footman, MD   DESCRIPTION OF PROCEDURE: The patient was brought to the operating room, and after adequate anesthesia was obtained, the left arm was prepped and draped in the usual sterile fashion. After patient identification and timeout procedures were completed, the tourniquet was raised to 250 mmHg. An incision was made between the fourth and fifth metacarpals, with care taken to preserve subcutaneous veins as much as possible. The fifth metacarpal was exposed and the fracture site identified. There was some early callus and malposition. The callus was taken down and the fracture was reduced anatomically and held in place with a K wire. A 1.5 mm plate from the [employer] hand set was obtained and contoured. Five locking screws were then placed, getting good fixation on the fracture. Through fluoroscopic evaluation, there was near-anatomic alignment with normal rotational fixation. The wound was then irrigated, closed with simple interrupted 4-0 nylon skin sutures, Xeroform, 4 x 4's, Webril, and an ulnar gutter splint; 10 mL of 1% Xylocaine was infiltrated prior to wound closure to aid in postoperative analgesia.   ESTIMATED BLOOD LOSS: Minimal.   COMPLICATIONS: None.   SPECIMEN: None.  IMPLANT: A 1.5 mm locking plate with locking screws.  TOURNIQUET TIME: 48 minutes at 250 mmHg.   ____________________________ Laurene Footman, MD mjm:ST D: 07/20/2014 21:22:10 ET T: 07/20/2014 22:11:11 ET JOB#: [id number]  cc: Laurene Footman, MD, <Dictator> Laurene Footman MD ELECTRONICALLY SIGNED 07/21/2014 7:17

## 2017-08-06 ENCOUNTER — Encounter (HOSPITAL_COMMUNITY): Payer: Self-pay | Admitting: *Deleted

## 2017-08-06 ENCOUNTER — Emergency Department (HOSPITAL_COMMUNITY): Payer: Self-pay

## 2017-08-06 ENCOUNTER — Emergency Department (HOSPITAL_COMMUNITY)
Admission: EM | Admit: 2017-08-06 | Discharge: 2017-08-06 | Disposition: A | Discharge status: Home / Self Care | Payer: Self-pay | Attending: Emergency Medicine | Admitting: Emergency Medicine

## 2017-08-06 DIAGNOSIS — J40 Bronchitis, not specified as acute or chronic: Secondary | ICD-10-CM | POA: Insufficient documentation

## 2017-08-06 DIAGNOSIS — Z79899 Other long term (current) drug therapy: Secondary | ICD-10-CM | POA: Insufficient documentation

## 2017-08-06 DIAGNOSIS — F17210 Nicotine dependence, cigarettes, uncomplicated: Secondary | ICD-10-CM | POA: Insufficient documentation

## 2017-08-06 MED ORDER — ALBUTEROL SULFATE (2.5 MG/3ML) 0.083% IN NEBU
INHALATION_SOLUTION | RESPIRATORY_TRACT | Status: AC
  Administered 2017-08-06: 5 mg via RESPIRATORY_TRACT
  Filled 2017-08-06: qty 6

## 2017-08-06 MED ORDER — ALBUTEROL SULFATE (2.5 MG/3ML) 0.083% IN NEBU: 2.5000 mg | INHALATION_SOLUTION | RESPIRATORY_TRACT | Status: DC

## 2017-08-06 MED ORDER — PREDNISONE 50 MG PO TABS: ORAL_TABLET | ORAL | 0 refills | Status: DC

## 2017-08-06 MED ORDER — ALBUTEROL SULFATE (2.5 MG/3ML) 0.083% IN NEBU
5.0000 mg | INHALATION_SOLUTION | Freq: Once | RESPIRATORY_TRACT | Status: AC
  Administered 2017-08-06: 5 mg via RESPIRATORY_TRACT

## 2017-08-06 MED ORDER — ALBUTEROL (5 MG/ML) CONTINUOUS INHALATION SOLN
5.0000 mg/h | INHALATION_SOLUTION | Freq: Once | RESPIRATORY_TRACT | Status: DC
  Filled 2017-08-06: qty 20

## 2017-08-06 MED ORDER — ALBUTEROL SULFATE HFA 108 (90 BASE) MCG/ACT IN AERS
2.0000 | INHALATION_SPRAY | RESPIRATORY_TRACT | Status: DC
  Administered 2017-08-06: 2 via RESPIRATORY_TRACT
  Filled 2017-08-06: qty 6.7

## 2017-08-06 MED ORDER — PREDNISONE 50 MG PO TABS
60.0000 mg | ORAL_TABLET | Freq: Once | ORAL | Status: AC
  Administered 2017-08-06: 60 mg via ORAL
  Filled 2017-08-06: qty 1

## 2017-08-06 MED ORDER — IPRATROPIUM-ALBUTEROL 0.5-2.5 (3) MG/3ML IN SOLN
3.0000 mL | Freq: Once | RESPIRATORY_TRACT | Status: AC
  Administered 2017-08-06: 3 mL via RESPIRATORY_TRACT
  Filled 2017-08-06: qty 3

## 2017-08-06 NOTE — ED Notes (Signed)
Ambulated pt around the department her breathing was labored at 34bpm and o2 stayed at 99%. Pt stated she felt slightly dizzy, edp aware.

## 2017-08-06 NOTE — ED Triage Notes (Signed)
Pt c/o non-productive cough and pain to right shoulder x 2 weeks

## 2017-08-06 NOTE — ED Provider Notes (Signed)
[place] DEPT Provider Note   CSN: [id number] Arrival date & time: 08/06/17  0406     History   Chief Complaint Chief Complaint  Patient presents with  . Cough    HPI Marie Barnes is a 41 y.o. female.  The history is provided by the patient.  Cough  This is a new problem. The current episode started more than 1 week ago. The problem occurs constantly. The problem has been gradually worsening. The cough is non-productive. There has been no fever. Associated symptoms include chest pain, sore throat and shortness of breath. She is a smoker.  patient reports cough for several weeks, worse over past 2 days No hemoptysis She reports cough wakes her up at night She reports right lower chest wall pain and right shoulder pain from coughing No fever/vomiting No new LE edema  Denies h/o DVT/PE  PMH -none Soc hx - smokes cigarettes No travel is reported Past Surgical History:  Procedure Laterality Date  . TUBAL LIGATION      OB History    No data available       Home Medications    Prior to Admission medications   Medication Sig Start Date End Date Taking? Authorizing Provider  ranitidine (ZANTAC) 150 MG tablet Take 150 mg by mouth 2 (two) times daily.   Yes [provider]    Family History Family History  Problem Relation Age of Onset  . Diabetes Other     Social History Social History  Substance Use Topics  . Smoking status: Current Every Day Smoker    Packs/day: 1.00    Types: Cigarettes  . Smokeless tobacco: Never Used  . Alcohol use Yes     Comment: occasional social drinker     Allergies   Aspirin and Penicillins   Review of Systems Review of Systems  Constitutional: Negative for fever.  HENT: Positive for sore throat.   Respiratory: Positive for cough and shortness of breath.   Cardiovascular: Positive for chest pain. Negative for leg swelling.  Gastrointestinal: Negative for vomiting.  All other systems reviewed and are  negative.    Physical Exam Updated Vital Signs BP 119/73 (BP Location: Right Arm)   Pulse 88   Temp 98 F (36.7 C) (Oral)   Resp 20   Ht 1.676 m (5\' 6" )   Wt 104.8 kg (231 lb)   LMP 07/07/2017   SpO2 96%   BMI 37.28 kg/m   Physical Exam CONSTITUTIONAL: Well developed/well nourished HEAD: Normocephalic/atraumatic EYES: EOMI/PERRL ENMT: Mucous membranes moist NECK: supple no meningeal signs, no JVD SPINE/BACK:entire spine nontender CV: S1/S2 noted, no murmurs/rubs/gallops noted LUNGS: mild tachypnea noted, scattered wheezing noted Chest - tenderness to right lower chest ABDOMEN: soft, nontender, no rebound or guarding, bowel sounds noted throughout abdomen GU:no cva tenderness NEURO: Pt is awake/alert/appropriate, moves all extremitiesx4.  No facial droop.   EXTREMITIES: pulses normal/equal, full ROM, no LE edema or calf tenderness noted SKIN: warm, color normal PSYCH: no abnormalities of mood noted, alert and oriented to situation   ED Treatments / Results  Labs (all labs ordered are listed, but only abnormal results are displayed) Labs Reviewed - No data to display  EKG  EKG Interpretation None       Radiology Dg Chest 2 View  Result Date: 08/06/2017 CLINICAL DATA:  Subacute onset of nonproductive cough and right shoulder pain. Initial encounter. EXAM: CHEST  2 VIEW COMPARISON:  None. FINDINGS: The lungs are well-aerated and clear. There is no evidence of  focal opacification, pleural effusion or pneumothorax. The heart is normal in size; the mediastinal contour is within normal limits. No acute osseous abnormalities are seen. IMPRESSION: No acute cardiopulmonary process seen. Electronically Signed   By: Garald Balding M.D.   On: 08/06/2017 04:54    Procedures Procedures (including critical care time)  Medications Ordered in ED Medications  albuterol (PROVENTIL) (2.5 MG/3ML) 0.083% nebulizer solution 2.5 mg (not administered)  ipratropium-albuterol (DUONEB)  0.5-2.5 (3) MG/3ML nebulizer solution 3 mL (3 mLs Nebulization Given 08/06/17 0453)  predniSONE (DELTASONE) tablet 60 mg (60 mg Oral Given 08/06/17 0447)  albuterol (PROVENTIL) (2.5 MG/3ML) 0.083% nebulizer solution 5 mg (5 mg Nebulization Given 08/06/17 0609)     Initial Impression / Assessment and Plan / ED Course  I have reviewed the triage vital signs and the nursing notes.  Pertinent  imaging results that were available during my care of the patient were reviewed by me and considered in my medical decision making (see chart for details).     Pt improved No distress After neb treatments, lung sounds improved Work of breathing improved My suspicion for PE/ACS/CHF is low at this time Will d/c home Advised to quit smoking We discussed strict ER return precautions Final Clinical Impressions(s) / ED Diagnoses   Final diagnoses:  Bronchitis    New Prescriptions Discharge Medication List as of 08/06/2017  6:46 AM    START taking these medications   Details  predniSONE (DELTASONE) 50 MG tablet One tablet po daily for 4 days, Print         Ripley Fraise, MD 08/06/17 [phone]

## 2018-10-01 ENCOUNTER — Other Ambulatory Visit: Payer: Self-pay

## 2018-10-01 ENCOUNTER — Emergency Department (HOSPITAL_COMMUNITY): Payer: Self-pay

## 2018-10-01 ENCOUNTER — Emergency Department (HOSPITAL_COMMUNITY)
Admission: EM | Admit: 2018-10-01 | Discharge: 2018-10-01 | Disposition: A | Discharge status: Home / Self Care | Payer: Self-pay | Attending: Emergency Medicine | Admitting: Emergency Medicine

## 2018-10-01 ENCOUNTER — Encounter (HOSPITAL_COMMUNITY): Payer: Self-pay | Admitting: *Deleted

## 2018-10-01 DIAGNOSIS — R5383 Other fatigue: Secondary | ICD-10-CM | POA: Insufficient documentation

## 2018-10-01 DIAGNOSIS — F1910 Other psychoactive substance abuse, uncomplicated: Secondary | ICD-10-CM

## 2018-10-01 DIAGNOSIS — R531 Weakness: Secondary | ICD-10-CM | POA: Insufficient documentation

## 2018-10-01 DIAGNOSIS — R202 Paresthesia of skin: Secondary | ICD-10-CM

## 2018-10-01 DIAGNOSIS — F17210 Nicotine dependence, cigarettes, uncomplicated: Secondary | ICD-10-CM | POA: Insufficient documentation

## 2018-10-01 DIAGNOSIS — R200 Anesthesia of skin: Secondary | ICD-10-CM | POA: Insufficient documentation

## 2018-10-01 DIAGNOSIS — F1410 Cocaine abuse, uncomplicated: Secondary | ICD-10-CM | POA: Insufficient documentation

## 2018-10-01 DIAGNOSIS — M25552 Pain in left hip: Secondary | ICD-10-CM | POA: Insufficient documentation

## 2018-10-01 DIAGNOSIS — F1210 Cannabis abuse, uncomplicated: Secondary | ICD-10-CM | POA: Insufficient documentation

## 2018-10-01 DIAGNOSIS — R51 Headache: Secondary | ICD-10-CM | POA: Insufficient documentation

## 2018-10-01 LAB — URINALYSIS, ROUTINE W REFLEX MICROSCOPIC
Bilirubin Urine: NEGATIVE
GLUCOSE, UA: NEGATIVE mg/dL
Hgb urine dipstick: NEGATIVE
Ketones, ur: NEGATIVE mg/dL
LEUKOCYTES UA: NEGATIVE
Nitrite: NEGATIVE
PROTEIN: NEGATIVE mg/dL
Specific Gravity, Urine: 1.005 (ref 1.005–1.030)
pH: 6 (ref 5.0–8.0)

## 2018-10-01 LAB — COMPREHENSIVE METABOLIC PANEL
ALT: 13 U/L (ref 0–44)
AST: 17 U/L (ref 15–41)
Albumin: 3.9 g/dL (ref 3.5–5.0)
Alkaline Phosphatase: 55 U/L (ref 38–126)
Anion gap: 10 (ref 5–15)
BUN: 6 mg/dL (ref 6–20)
CO2: 20 mmol/L — ABNORMAL LOW (ref 22–32)
Calcium: 8.7 mg/dL — ABNORMAL LOW (ref 8.9–10.3)
Chloride: 108 mmol/L (ref 98–111)
Creatinine, Ser: 0.71 mg/dL (ref 0.44–1.00)
GFR calc non Af Amer: >60 mL/min (ref >60)
Glucose, Bld: 80 mg/dL (ref 70–99)
Potassium: 3.9 mmol/L (ref 3.5–5.1)
Sodium: 138 mmol/L (ref 135–145)
Total Bilirubin: 0.3 mg/dL (ref 0.3–1.2)
Total Protein: 7.7 g/dL (ref 6.5–8.1)

## 2018-10-01 LAB — CBC
HCT: 41.6 % (ref 36.0–46.0)
HEMOGLOBIN: 12.9 g/dL (ref 12.0–15.0)
MCH: 29.1 pg (ref 26.0–34.0)
MCHC: 31 g/dL (ref 30.0–36.0)
MCV: 93.9 fL (ref 80.0–100.0)
Platelets: 251 10*3/uL (ref 150–400)
RBC: 4.43 MIL/uL (ref 3.87–5.11)
RDW: 13.3 % (ref 11.5–15.5)
WBC: 9.4 10*3/uL (ref 4.0–10.5)
nRBC: 0 % (ref 0.0–0.2)

## 2018-10-01 LAB — DIFFERENTIAL
Abs Immature Granulocytes: 0.04 10*3/uL (ref 0.00–0.07)
Basophils Absolute: 0 10*3/uL (ref 0.0–0.1)
Basophils Relative: 0 %
Eosinophils Absolute: 0 10*3/uL (ref 0.0–0.5)
Eosinophils Relative: 0 %
Immature Granulocytes: 0 %
Lymphocytes Relative: 40 %
Lymphs Abs: 3.8 10*3/uL (ref 0.7–4.0)
MONO ABS: 0.6 10*3/uL (ref 0.1–1.0)
Monocytes Relative: 6 %
Neutro Abs: 4.9 10*3/uL (ref 1.7–7.7)
Neutrophils Relative %: 54 %

## 2018-10-01 LAB — RAPID URINE DRUG SCREEN, HOSP PERFORMED
Amphetamines: NOT DETECTED
Barbiturates: NOT DETECTED
Benzodiazepines: NOT DETECTED
Cocaine: POSITIVE — AB
Opiates: NOT DETECTED
Tetrahydrocannabinol: NOT DETECTED

## 2018-10-01 LAB — APTT: APTT: 24 s (ref 24–36)

## 2018-10-01 LAB — TROPONIN I: Troponin I: <0.03 ng/mL (ref <0.03)

## 2018-10-01 LAB — PROTIME-INR
INR: 0.99
Prothrombin Time: 13 seconds (ref 11.4–15.2)

## 2018-10-01 LAB — CK: Total CK: 68 U/L (ref 38–234)

## 2018-10-01 LAB — HCG, SERUM, QUALITATIVE: Preg, Serum: NEGATIVE

## 2018-10-01 LAB — ETHANOL: ALCOHOL ETHYL (B): 112 mg/dL — AB (ref <10)

## 2018-10-01 MED ORDER — ACETAMINOPHEN 325 MG PO TABS
650.0000 mg | ORAL_TABLET | Freq: Once | ORAL | Status: AC
  Administered 2018-10-01: 650 mg via ORAL
  Filled 2018-10-01: qty 2

## 2018-10-01 NOTE — Discharge Instructions (Addendum)
RETURN IMMEDIATELY IF  you develop new shortness of breath, chest pain, fever, have difficulty moving parts of your body (new weakness, numbness, or incoordination), sudden change in speech, vision, swallowing, or understanding, faint or develop new dizziness, severe headache, become poorly responsive or have an altered mental status compared to baseline for you, new rash, abdominal pain, or bloody stools,  Return sooner also if you develop new problems for which you have not talked to your caregiver but you feel may be emergency medical conditions.   Substance Abuse Treatment Programs  Intensive Outpatient Programs [facility]     [address], [place]       [facility] [address] [place], [place]  [facility]     (Inpatient and outpatient)     [address]           [place] [phone] (Suboxone and Methadone)  [place], [place] [address]      [place] [address] [place], [place]  [facility] [place] [facility] (Outpatient/Inpatient, Chemical)    (insurance only) [phone]             [facility] ([place]) [place], [place]     [facility]     [address]     [place], [place]       [facility] (for caregivers and family) [phone] Kristeen Mans. [place], [place]      Residential Treatment Programs [facility]      [address] [place], [place] [address]  [phone]       [place], [place] [address] [phone]  [facility]        [phone]       [facility] [place] [phone]  [facility] ([place].)             [place], [place] or [place] of [place] [address] [place], [address] [phone]  [facility]    [address]      [place], [place]       [facility] [address] [place], [place]  [place]   [address] [place], [place] [address]     [phone]      Admissions: 8am-3pm M-F  [facility] (RTS) [address]  [place], [place]  [facility]ATS Program: Residential Program ([address])   [place], [place] or [phone]     ADATC: [place], [place] (Walk in Hours over the weekend or by referral)  [facility] [place], [place], [place] [address] [phone]  Crisis Mobile: [facility]:  [phone] (for crisis response 24 hours a day) [facility] Hotline:      [phone] Outpatient Psychiatry and Counseling  [facility]: Mobile Crisis Management 24 hours:  [phone]  [facility] the [employer] sliding scale fee and walk in schedule: M-F 8am-12pm/1pm-3pm [place], [place] [address] [address] [place], [place] [address] [phone]  [facility] (Formerly known as [facility] [employer])- new patient walk-in appointments available Monday - Friday 8am -3pm.          [address] [place], [place] [address] [phone] or crisis line- [facility]/ Intensive Outpatient Therapy Program [place], [place] [address] [place]      [phone] [address] [place], [place] [address]                 [place]   [facility] [phone]. [place], [place] [address]   [employer]          [address] Johnette Abraham  [place], [place] [address]       [phone]  [facility] [address], [place], [place] [address] [phone]  [facility]    [address] [place], [place] [address]     [place], [place] Joycelyn Man     [place] [address]     [phone]       [facility] [place] [address]  [facility]     [address], [place], MD [place], [place] [address] [place]     [address]     [place], [place] [address]     [phone]       [facility] [address] [place], [place] [address] [phone] Resources for Temporary Residential Assistance/Crisis [facility]) M-F 8am-3pm   [address] [place], [place] [address]   [phone] Services include: laundry, barbering, support groups, case management, phone  & computer access, showers, AA/NA mtgs, mental health/substance abuse nurse, job skills class, disability information, VA assistance, spiritual classes, etc.   [facility]   [address], [place]     [place]              [employer] (women and children)       [place]. [place],  [address] [phone] [email or web address] for application and process Application Required  [facility]   [address]    [place] [address]     [phone]                    [place], [place]  [address] [phone]([email or web address] application appt.) Application Required  [facility] (women only)    [address]     [place], [place] [address]     [phone]      Intake starts 6pm daily Need valid ID, SSC, & Police report [employer] [address] [place], [place] [phone] Application Required  [employer] (men only)     [place]      [place], [place]       [place] (Pregnant women only) [address] [place], [place]  [facility]      [place] Dani [address].      [place], [place] [address]     [phone]             [facility] [address] [place], [place] 90 day commitment/SA/Application process  [facility](men only)     [address]     [place], [place]       Check-in at [facility] [address] [place],  [address] [phone] Men/Women/Women and Children must be there by 7 pm  [place], [place]

## 2018-10-01 NOTE — ED Notes (Signed)
Waiting for pregnancy test results.

## 2018-10-01 NOTE — ED Notes (Signed)
Pt ambulated in hallway, maintained 99% o2 sat. Pt stated 'feeling dizzy'

## 2018-10-01 NOTE — ED Notes (Signed)
ED Provider at bedside. 

## 2018-10-01 NOTE — ED Provider Notes (Signed)
[facility] EMERGENCY DEPARTMENT Provider Note   CSN: [id number] Arrival date & time: 10/01/18  0123     History   Chief Complaint Chief Complaint  Patient presents with  . Leg Pain    HPI Destyn Parfitt is a 42 y.o. female.  The history is provided by the patient.  Neurologic Problem  This is a new problem. Episode onset: unknown. The problem occurs constantly. The problem has not changed since onset.Associated symptoms include chest pain and headaches. Nothing aggravates the symptoms. Nothing relieves the symptoms. She has tried nothing for the symptoms.  Patient with history of cocaine abuse presents with multiple complaints.  She reports numbness to the left side of her face/left arm/left leg worsened in the past day.  However she is unsure when it started.  She also reports feeling weak on the left side.  She also reports left hip pain of unclear etiology.  She has also had some chest tightness at times, none at this time.  No fevers or vomiting.  She had headaches earlier in the day, but none at this time.  She reports feeling fatigued.  She reports the symptoms started before the use of cocaine in the day.  She reports cocaine use gives her energy, but she feels very fatigued instead   PMH-none Soc hx - cocaine use Past Surgical History:  Procedure Laterality Date  . TUBAL LIGATION       OB History   None      Home Medications    Prior to Admission medications   Medication Sig Start Date End Date Taking? Authorizing Provider  ranitidine (ZANTAC) 150 MG tablet Take 150 mg by mouth 2 (two) times daily.    [provider]    Family History Family History  Problem Relation Age of Onset  . Diabetes Other     Social History Social History   Tobacco Use  . Smoking status: Current Every Day Smoker    Packs/day: 1.00    Types: Cigarettes  . Smokeless tobacco: Never Used  Substance Use Topics  . Alcohol use: Yes    Comment: occasional social  drinker  . Drug use: Yes    Types: Marijuana, Cocaine     Allergies   Aspirin and Penicillins   Review of Systems Review of Systems  Constitutional: Positive for fatigue. Negative for fever.  Cardiovascular: Positive for chest pain.  Musculoskeletal: Positive for arthralgias.  Neurological: Positive for numbness and headaches.  All other systems reviewed and are negative.    Physical Exam Updated Vital Signs BP 113/77   Pulse 84   Temp 98 F (36.7 C) (Oral)   Resp (!) 23   Ht 1.676 m (5\' 6" )   Wt 104 kg   LMP 09/01/2018   SpO2 96%   BMI 37.01 kg/m   Physical Exam CONSTITUTIONAL: Disheveled, no acute distress HEAD: Normocephalic/atraumatic EYES: EOMI/PERRL, no nystagmus no ptosis ENMT: Mucous membranes moist NECK: supple no meningeal signs, no bruits CV: S1/S2 noted, no murmurs/rubs/gallops noted LUNGS: Lungs are clear to auscultation bilaterally, no apparent distress ABDOMEN: soft, nontender, no rebound or guarding GU:no cva tenderness NEURO:Awake/alert, face symmetric, no arm or leg drift is noted Equal 5/5 strength with hip flexion,knee flex/extension, foot dorsi/plantar flexion Cranial nerves 3/4/5/6/05/06/09/11/12 tested and intact She reports subjective numbness to left arm and left leg. EXTREMITIES: pulses normal, full ROM, full range of motion of left hip, there is no bruising, no erythema noted to left hip, nurses present for exam. SKIN: warm,  color normal PSYCH: Mildly anxious  ED Treatments / Results  Labs (all labs ordered are listed, but only abnormal results are displayed) Labs Reviewed  ETHANOL  PROTIME-INR  APTT  CBC  DIFFERENTIAL  COMPREHENSIVE METABOLIC PANEL  RAPID URINE DRUG SCREEN, HOSP PERFORMED  URINALYSIS, ROUTINE W REFLEX MICROSCOPIC  CK  TROPONIN I  HCG, SERUM, QUALITATIVE    EKG None ED ECG REPORT   Date: 10/01/2018 0137am  Rate: 83  Rhythm: normal sinus rhythm  QRS Axis: normal  Intervals: normal  ST/T Wave  abnormalities: normal  Conduction Disutrbances:none    I have personally reviewed the EKG tracing and agree with the computerized printout as noted.  Radiology No results found.  Procedures Procedures    Medications Ordered in ED Medications - No data to display   Initial Impression / Assessment and Plan / ED Course  I have reviewed the triage vital signs and the nursing notes. 3:00 AM Patient presents for multiple complaints.  Her main issue is left-sided numbness of unclear onset.  She does admit to smoking cocaine nearly on a daily basis. tPA in stroke considered but not given due to: Onset over 3-4.5hours Labs and imaging are pending at this time  Pertinent labs & imaging results that were available during my care of the patient were reviewed by me and considered in my medical decision making (see chart for details).     6:41am Patient monitored for several hours and feels improved. Labs/imaging reassuring.  Imaging is negative.  Patient was able to ambulate.  She reported some mild dizziness, but she ambulated on her own. No arm or leg drift.  She denies any sensory deficit this time.  She has equal pulses in all extremities.  She had multiple complaints on arrival including numbness, hip pain and fatigue.  CT head is negative, and after further discussion, my suspicion for acute neurologic emergency is low. I do not feel further work-up of CVA/TIA is warranted. She was given outpatient resources for substance abuse.  She feels motivated to quit cocaine.  She was also given Ortho referral for her left hip pain, but there is no signs of any acute traumatic injury to the hip. Final Clinical Impressions(s) / ED Diagnoses   Final diagnoses:  None    ED Discharge Orders    None       Ripley Fraise, MD 10/01/18 [phone]

## 2018-10-01 NOTE — ED Triage Notes (Signed)
Pt brought in by [facility] for c/o left side of body pain with pain to her arm and leg; cbg 98; pt positive for ETOH; pt c/o chest pain and decreased energy; ems reports pt has been using cocaine today

## 2019-01-10 ENCOUNTER — Encounter (HOSPITAL_COMMUNITY): Payer: Self-pay | Admitting: Emergency Medicine

## 2019-01-10 ENCOUNTER — Emergency Department (HOSPITAL_COMMUNITY)
Admission: EM | Admit: 2019-01-10 | Discharge: 2019-01-10 | Disposition: A | Discharge status: Home / Self Care | Payer: Medicaid Other | Attending: Emergency Medicine | Admitting: Emergency Medicine

## 2019-01-10 ENCOUNTER — Other Ambulatory Visit: Payer: Self-pay

## 2019-01-10 DIAGNOSIS — N926 Irregular menstruation, unspecified: Secondary | ICD-10-CM

## 2019-01-10 DIAGNOSIS — R110 Nausea: Secondary | ICD-10-CM | POA: Insufficient documentation

## 2019-01-10 DIAGNOSIS — F17210 Nicotine dependence, cigarettes, uncomplicated: Secondary | ICD-10-CM | POA: Insufficient documentation

## 2019-01-10 DIAGNOSIS — N912 Amenorrhea, unspecified: Secondary | ICD-10-CM | POA: Insufficient documentation

## 2019-01-10 MED ORDER — ONDANSETRON HCL 4 MG PO TABS: 4.0000 mg | ORAL_TABLET | Freq: Four times a day (QID) | ORAL | 0 refills | Status: DC

## 2019-01-10 NOTE — Discharge Instructions (Addendum)
Your vital signs are within normal limits.  Your pregnancy test is negative.  Please use Zofran every 6 hours as needed for nausea.  Please discussed these changes in your menstrual cycle with your OB specialist.

## 2019-01-10 NOTE — ED Provider Notes (Signed)
[facility] EMERGENCY DEPARTMENT Provider Note   CSN: [id number] Arrival date & time: 01/10/19  1324    History   Chief Complaint Chief Complaint  Patient presents with  . Nausea    HPI Marie Barnes is a 44 y.o. female.     Patient is a 43 year old female who presents to the emergency department with nausea.  The patient states that she has had nausea throughout the day today.  She reports that her menstrual cycle was supposed to start on March 3.  She has been nauseated and she wants to have a pregnancy test done.  The patient denies being on any birth control.  She is not had any recent operations or procedures of the GU system.  There is been no recent injury to the GU system.  She presents now for assistance with this issue.  The history is provided by the patient.    History reviewed. No pertinent past medical history.  There are no active problems to display for this patient.   Past Surgical History:  Procedure Laterality Date  . TUBAL LIGATION       OB History   No obstetric history on file.      Home Medications    Prior to Admission medications   Medication Sig Start Date End Date Taking? Authorizing Provider  ranitidine (ZANTAC) 150 MG tablet Take 150 mg by mouth 2 (two) times daily.    [provider]    Family History Family History  Problem Relation Age of Onset  . Diabetes Other     Social History Social History   Tobacco Use  . Smoking status: Current Every Day Smoker    Packs/day: 1.00    Types: Cigarettes  . Smokeless tobacco: Never Used  Substance Use Topics  . Alcohol use: Yes    Comment: occasional social drinker  . Drug use: Yes    Types: Marijuana, Cocaine     Allergies   Aspirin and Penicillins   Review of Systems Review of Systems  Constitutional: Negative for activity change.       All ROS Neg except as noted in HPI  HENT: Negative for nosebleeds.   Eyes: Negative for photophobia and discharge.   Respiratory: Negative for cough, shortness of breath and wheezing.   Cardiovascular: Negative for chest pain and palpitations.  Gastrointestinal: Positive for nausea. Negative for abdominal pain and blood in stool.  Genitourinary: Positive for menstrual problem. Negative for dysuria, frequency and hematuria.  Musculoskeletal: Negative for arthralgias, back pain and neck pain.  Skin: Negative.   Neurological: Negative for dizziness, seizures and speech difficulty.  Psychiatric/Behavioral: Negative for confusion and hallucinations.     Physical Exam Updated Vital Signs BP 121/81 (BP Location: Right Arm)   Pulse 85   Temp 98.6 F (37 C) (Oral)   Resp 17   Ht 5\' 6"  (1.676 m)   SpO2 100%   BMI 37.01 kg/m   Physical Exam Vitals signs and nursing note reviewed.  Constitutional:      Appearance: She is well-developed. She is not toxic-appearing.  HENT:     Head: Normocephalic.     Right Ear: Tympanic membrane and external ear normal.     Left Ear: Tympanic membrane and external ear normal.  Eyes:     General: Lids are normal.     Pupils: Pupils are equal, round, and reactive to light.  Neck:     Musculoskeletal: Normal range of motion and neck supple.  Vascular: No carotid bruit.  Cardiovascular:     Rate and Rhythm: Normal rate and regular rhythm.     Pulses: Normal pulses.     Heart sounds: Normal heart sounds.  Pulmonary:     Effort: No respiratory distress.     Breath sounds: Normal breath sounds.  Abdominal:     General: Bowel sounds are normal.     Palpations: Abdomen is soft.     Tenderness: There is no abdominal tenderness. There is no guarding.  Musculoskeletal: Normal range of motion.  Lymphadenopathy:     Head:     Right side of head: No submandibular adenopathy.     Left side of head: No submandibular adenopathy.     Cervical: No cervical adenopathy.  Skin:    General: Skin is warm and dry.  Neurological:     Mental Status: She is alert and oriented to  person, place, and time.     Cranial Nerves: No cranial nerve deficit.     Sensory: No sensory deficit.  Psychiatric:        Speech: Speech normal.      ED Treatments / Results  Labs (all labs ordered are listed, but only abnormal results are displayed) Labs Reviewed  POC URINE PREG, ED    EKG None  Radiology No results found.  Procedures Procedures (including critical care time)  Medications Ordered in ED Medications - No data to display   Initial Impression / Assessment and Plan / ED Course  I have reviewed the triage vital signs and the nursing notes.  Pertinent labs & imaging results that were available during my care of the patient were reviewed by me and considered in my medical decision making (see chart for details).          Final Clinical Impressions(s) / ED Diagnoses MDM  Vital signs are within normal limits.  There are no acute changes noted on the physical examination.  The patient has not had any recent changes in medication or diet.  The patient is not on any birth control at this time.  No recent operations or injuries.  Urine pregnancy test is negative.  I have instructed the patient on the vital signs, on the examination findings, and the urine pregnancy test.  I have asked her to see her GYN specialist for additional evaluation concerning the abnormality of her menstrual cycles.  The patient is in agreement with this plan.   Final diagnoses:  Missed period  Nausea    ED Discharge Orders    None       Lily Kocher, PA-C 01/12/19 1316    Maudie Flakes, MD 01/13/19 [phone]

## 2019-01-10 NOTE — ED Triage Notes (Addendum)
Pt states her period has not come yet this month.  Was supposed to start on the 3rd of this month.  Nausea throughout the day with leg pain.  Wants a pregnancy test

## 2019-01-12 LAB — POC URINE PREG, ED: Preg Test, Ur: NEGATIVE

## 2019-01-14 NOTE — Congregational Nurse Program (Signed)
  Dept: [phone]   Congregational Nurse Program Note  Date of Encounter: 01/14/2019  Past Medical History: No past medical history on file.  Encounter Details: CNP Questionnaire - 01/14/19 1040      Questionnaire   Patient Status  Not Applicable    Race  Black or African American    Location Patient Served At  [employer]    Uninsured  Not Applicable    Food  Yes, have food insecurities    Housing/Utilities  Yes, have permanent housing    Transportation  No transportation needs    Interpersonal Safety  Yes, feel physically and emotionally safe where you currently live    Medication  No medication insecurities    Medical Provider  Yes    Referrals  Not Applicable    ED Visit Averted  Not Applicable    Life-Saving Intervention Made  Not Applicable     10/31/1115   Want Blood Pressure checked B/P 108/75 Pulse 85.  [facility]. No Menses For one month, had pregnancy test @ hospital, it was negative. Marko Plume [phone].

## 2019-01-15 DIAGNOSIS — Z139 Encounter for screening, unspecified: Secondary | ICD-10-CM

## 2019-01-15 LAB — GLUCOSE, POCT (MANUAL RESULT ENTRY): POC Glucose: 93 mg/dl (ref 70–99)

## 2019-01-15 LAB — POCT URINALYSIS DIPSTICK
Bilirubin, UA: NEGATIVE
Blood, UA: NEGATIVE
Glucose, UA: NEGATIVE
Ketones, UA: NEGATIVE
LEUKOCYTES UA: NEGATIVE
NITRITE UA: NEGATIVE
Protein, UA: NEGATIVE
Spec Grav, UA: 1.02 (ref 1.010–1.025)
Urobilinogen, UA: 0.2 E.U./dL
pH, UA: 7.5 (ref 5.0–8.0)

## 2019-01-19 NOTE — Congregational Nurse Program (Signed)
  Dept: [phone]   Congregational Nurse Program Note  Date of Encounter: 01/15/2019  Past Medical History: No past medical history on file.  Encounter Details:   Client in to enroll in Care connect today with C. [facility]. Client is currently living with a friend. She was recently seen in [place] on 01/10/19 for missed periods. She was given a pregnancy test which was negative and was recommended to follow up with [facility]. Client has not followed up as of yet due to co-payment.   She states her Last menses was Feb.3rd, 2020. Last PAP was while she was in [place] client estimates about 6 mos ago.  Today she is also complaining of some new left flank pain.  Her vitals are Temp 98.0, pulse 81, blood pressure 111/70 oxygen saturation 97%.  She denies painful urination and denies noticing any blood when she urinates. POCT urine performed.  POCT glucose 93 random sample Urine clear  Color amber Glu: NEGATIVE BIL: Negative KET: Negative SG: 1.020 BLO : Negative PH: 7.5 PRO: Negative URO 0.2 E.U/Dl NIT: Negative Leu: Negative  Past Medical History Anxiety Depression GERD Substance abuse Heart Murmur as a child  Past surgical History: Tubal ligation 21 years ago.  Client denies suicidal or homicidal thoughts, she was on medication in prison but can only remember zoloft and there was another she cannot remember or dosages. She has not connected with any mental health provider since release from [place].   Plan: Referral to a primary medical care provider. [facility]. Appointment secured for 02/09/19 at 1:00 Pm  Referral that client connect with [facility] for evaluation and treatment of anxiety and depression. [facility] in intake times written and given to client. Mon-Friday from 8am to 3pm Crisis card for [facility] also given to client with phone number for 24hr line.  24 hr [facility] Nurse Advice line number [phone] also  written and given to client with explanation that if she is in doubt if she should go to Urgent Care or ER or her medical provider they could help guide her. Client states understanding.  RN discussed if she has increased pain, fever, nausea and vomiting to seek emergency treatment. Client states understanding.  Will follow up with client after her initial new patient visit with [facility].  Debria Garret RN

## 2019-01-21 ENCOUNTER — Telehealth: Payer: Self-pay | Admitting: Physician Assistant

## 2019-02-09 ENCOUNTER — Ambulatory Visit: Payer: Self-pay | Admitting: Physician Assistant

## 2019-07-02 ENCOUNTER — Inpatient Hospital Stay (HOSPITAL_COMMUNITY): Payer: Self-pay

## 2019-07-02 ENCOUNTER — Encounter (HOSPITAL_COMMUNITY): Payer: Self-pay | Admitting: Radiology

## 2019-07-02 ENCOUNTER — Encounter (HOSPITAL_COMMUNITY)
Admission: EM | Disposition: A | Discharge status: Home / Self Care | Payer: Self-pay | Source: Home / Self Care | Attending: Orthopedic Surgery

## 2019-07-02 ENCOUNTER — Inpatient Hospital Stay (HOSPITAL_COMMUNITY)
Admission: EM | Admit: 2019-07-02 | Discharge: 2019-07-06 | DRG: 956 | Disposition: A | Discharge status: Home / Self Care | Payer: Self-pay | Attending: Orthopedic Surgery | Admitting: Orthopedic Surgery

## 2019-07-02 ENCOUNTER — Inpatient Hospital Stay (HOSPITAL_COMMUNITY): Payer: Self-pay | Admitting: Certified Registered"

## 2019-07-02 ENCOUNTER — Emergency Department (HOSPITAL_COMMUNITY): Payer: Self-pay

## 2019-07-02 ENCOUNTER — Other Ambulatory Visit: Payer: Self-pay

## 2019-07-02 DIAGNOSIS — S32492A Other specified fracture of left acetabulum, initial encounter for closed fracture: Secondary | ICD-10-CM | POA: Diagnosis present

## 2019-07-02 DIAGNOSIS — D62 Acute posthemorrhagic anemia: Secondary | ICD-10-CM | POA: Diagnosis not present

## 2019-07-02 DIAGNOSIS — M898X9 Other specified disorders of bone, unspecified site: Secondary | ICD-10-CM | POA: Diagnosis present

## 2019-07-02 DIAGNOSIS — R52 Pain, unspecified: Secondary | ICD-10-CM

## 2019-07-02 DIAGNOSIS — S72092A Other fracture of head and neck of left femur, initial encounter for closed fracture: Principal | ICD-10-CM | POA: Diagnosis present

## 2019-07-02 DIAGNOSIS — E559 Vitamin D deficiency, unspecified: Secondary | ICD-10-CM | POA: Diagnosis present

## 2019-07-02 DIAGNOSIS — S73005A Unspecified dislocation of left hip, initial encounter: Secondary | ICD-10-CM

## 2019-07-02 DIAGNOSIS — Z880 Allergy status to penicillin: Secondary | ICD-10-CM

## 2019-07-02 DIAGNOSIS — S79912A Unspecified injury of left hip, initial encounter: Secondary | ICD-10-CM | POA: Diagnosis not present

## 2019-07-02 DIAGNOSIS — Z20828 Contact with and (suspected) exposure to other viral communicable diseases: Secondary | ICD-10-CM | POA: Diagnosis present

## 2019-07-02 DIAGNOSIS — F1410 Cocaine abuse, uncomplicated: Secondary | ICD-10-CM | POA: Diagnosis present

## 2019-07-02 DIAGNOSIS — Z23 Encounter for immunization: Secondary | ICD-10-CM | POA: Diagnosis not present

## 2019-07-02 DIAGNOSIS — F10929 Alcohol use, unspecified with intoxication, unspecified: Secondary | ICD-10-CM | POA: Diagnosis present

## 2019-07-02 DIAGNOSIS — Z886 Allergy status to analgesic agent status: Secondary | ICD-10-CM

## 2019-07-02 DIAGNOSIS — F17200 Nicotine dependence, unspecified, uncomplicated: Secondary | ICD-10-CM | POA: Diagnosis present

## 2019-07-02 DIAGNOSIS — Z419 Encounter for procedure for purposes other than remedying health state, unspecified: Secondary | ICD-10-CM

## 2019-07-02 DIAGNOSIS — F1210 Cannabis abuse, uncomplicated: Secondary | ICD-10-CM | POA: Diagnosis present

## 2019-07-02 DIAGNOSIS — S73015A Posterior dislocation of left hip, initial encounter: Secondary | ICD-10-CM | POA: Diagnosis present

## 2019-07-02 DIAGNOSIS — F1490 Cocaine use, unspecified, uncomplicated: Secondary | ICD-10-CM | POA: Diagnosis present

## 2019-07-02 DIAGNOSIS — F17210 Nicotine dependence, cigarettes, uncomplicated: Secondary | ICD-10-CM | POA: Diagnosis present

## 2019-07-02 DIAGNOSIS — S72002A Fracture of unspecified part of neck of left femur, initial encounter for closed fracture: Secondary | ICD-10-CM | POA: Diagnosis present

## 2019-07-02 DIAGNOSIS — S72059A Unspecified fracture of head of unspecified femur, initial encounter for closed fracture: Secondary | ICD-10-CM | POA: Diagnosis present

## 2019-07-02 DIAGNOSIS — W19XXXA Unspecified fall, initial encounter: Secondary | ICD-10-CM

## 2019-07-02 LAB — COMPREHENSIVE METABOLIC PANEL
ALT: 21 U/L (ref 0–44)
AST: 24 U/L (ref 15–41)
Albumin: 4 g/dL (ref 3.5–5.0)
Alkaline Phosphatase: 48 U/L (ref 38–126)
Anion gap: 14 (ref 5–15)
BUN: 7 mg/dL (ref 6–20)
CO2: 16 mmol/L — ABNORMAL LOW (ref 22–32)
Calcium: 8.4 mg/dL — ABNORMAL LOW (ref 8.9–10.3)
Chloride: 108 mmol/L (ref 98–111)
Creatinine, Ser: 1.04 mg/dL — ABNORMAL HIGH (ref 0.44–1.00)
GFR calc Af Amer: >60 mL/min (ref >60)
GFR calc non Af Amer: >60 mL/min (ref >60)
Glucose, Bld: 105 mg/dL — ABNORMAL HIGH (ref 70–99)
Potassium: 4.1 mmol/L (ref 3.5–5.1)
Sodium: 138 mmol/L (ref 135–145)
Total Bilirubin: 0.4 mg/dL (ref 0.3–1.2)
Total Protein: 6.8 g/dL (ref 6.5–8.1)

## 2019-07-02 LAB — HCG, QUANTITATIVE, PREGNANCY: hCG, Beta Chain, Quant, S: 1 m[IU]/mL (ref <5)

## 2019-07-02 LAB — CBC WITH DIFFERENTIAL/PLATELET
Abs Immature Granulocytes: 0.08 10*3/uL — ABNORMAL HIGH (ref 0.00–0.07)
Basophils Absolute: 0.1 10*3/uL (ref 0.0–0.1)
Basophils Relative: 1 %
Eosinophils Absolute: 0.1 10*3/uL (ref 0.0–0.5)
Eosinophils Relative: 1 %
HCT: 40.6 % (ref 36.0–46.0)
Hemoglobin: 12.9 g/dL (ref 12.0–15.0)
Immature Granulocytes: 1 %
Lymphocytes Relative: 23 %
Lymphs Abs: 2.4 10*3/uL (ref 0.7–4.0)
MCH: 31.5 pg (ref 26.0–34.0)
MCHC: 31.8 g/dL (ref 30.0–36.0)
MCV: 99 fL (ref 80.0–100.0)
Monocytes Absolute: 0.7 10*3/uL (ref 0.1–1.0)
Monocytes Relative: 7 %
Neutro Abs: 7.1 10*3/uL (ref 1.7–7.7)
Neutrophils Relative %: 67 %
Platelets: 247 10*3/uL (ref 150–400)
RBC: 4.1 MIL/uL (ref 3.87–5.11)
RDW: 13.6 % (ref 11.5–15.5)
WBC: 10.5 10*3/uL (ref 4.0–10.5)
nRBC: 0 % (ref 0.0–0.2)

## 2019-07-02 LAB — SARS CORONAVIRUS 2 (TAT 6-24 HRS): SARS Coronavirus 2: NEGATIVE

## 2019-07-02 LAB — RAPID URINE DRUG SCREEN, HOSP PERFORMED
Amphetamines: NOT DETECTED
Barbiturates: NOT DETECTED
Benzodiazepines: POSITIVE — AB
Cocaine: POSITIVE — AB
Opiates: NOT DETECTED
Tetrahydrocannabinol: POSITIVE — AB

## 2019-07-02 LAB — ETHANOL: Alcohol, Ethyl (B): 89 mg/dL — ABNORMAL HIGH (ref <10)

## 2019-07-02 LAB — ABO/RH: ABO/RH(D): O POS

## 2019-07-02 LAB — TYPE AND SCREEN
ABO/RH(D): O POS
Antibody Screen: NEGATIVE

## 2019-07-02 SURGERY — IRRIGATION AND DEBRIDEMENT ANTERIOR HIP
Anesthesia: Choice | Site: Hip | Laterality: Left

## 2019-07-02 MED ORDER — MENTHOL 3 MG MT LOZG: 1.0000 | LOZENGE | OROMUCOSAL | Status: DC | PRN

## 2019-07-02 MED ORDER — ENOXAPARIN SODIUM 40 MG/0.4ML ~~LOC~~ SOLN: 40.0000 mg | SUBCUTANEOUS | Status: DC

## 2019-07-02 MED ORDER — HYDROMORPHONE HCL 1 MG/ML IJ SOLN
1.0000 mg | Freq: Once | INTRAMUSCULAR | Status: AC
  Administered 2019-07-02: 09:00:00 1 mg via INTRAVENOUS
  Filled 2019-07-02: qty 1

## 2019-07-02 MED ORDER — HYDROMORPHONE HCL 1 MG/ML IJ SOLN
1.0000 mg | INTRAMUSCULAR | Status: DC | PRN
  Administered 2019-07-02: 1 mg via INTRAVENOUS
  Filled 2019-07-02: qty 1

## 2019-07-02 MED ORDER — ONDANSETRON HCL 4 MG/2ML IJ SOLN
INTRAMUSCULAR | Status: DC | PRN
  Administered 2019-07-02: 4 mg via INTRAVENOUS

## 2019-07-02 MED ORDER — DEXTROSE 5 % IV SOLN
3.0000 g | Freq: Once | INTRAVENOUS | Status: AC
  Administered 2019-07-02: 17:00:00 3 g via INTRAVENOUS
  Filled 2019-07-02: qty 3000

## 2019-07-02 MED ORDER — SODIUM CHLORIDE 0.9 % IV SOLN
INTRAVENOUS | Status: DC | PRN
  Administered 2019-07-02: 18:00:00 50 ug/min via INTRAVENOUS

## 2019-07-02 MED ORDER — FAMOTIDINE 20 MG PO TABS: 20.0000 mg | ORAL_TABLET | Freq: Two times a day (BID) | ORAL | Status: DC

## 2019-07-02 MED ORDER — MIDAZOLAM HCL 2 MG/2ML IJ SOLN
INTRAMUSCULAR | Status: AC
  Filled 2019-07-02: qty 2

## 2019-07-02 MED ORDER — FENTANYL CITRATE (PF) 100 MCG/2ML IJ SOLN
INTRAMUSCULAR | Status: AC
  Filled 2019-07-02: qty 2

## 2019-07-02 MED ORDER — OXYCODONE HCL 5 MG PO TABS
5.0000 mg | ORAL_TABLET | ORAL | Status: DC | PRN
  Administered 2019-07-03: 10 mg via ORAL
  Administered 2019-07-03 (×2): 5 mg via ORAL
  Filled 2019-07-02: qty 2
  Filled 2019-07-02 (×2): qty 1
  Filled 2019-07-02: qty 2

## 2019-07-02 MED ORDER — DEXMEDETOMIDINE HCL 200 MCG/2ML IV SOLN
INTRAVENOUS | Status: DC | PRN
  Administered 2019-07-02: 100 ug via INTRAVENOUS

## 2019-07-02 MED ORDER — FENTANYL CITRATE (PF) 250 MCG/5ML IJ SOLN
INTRAMUSCULAR | Status: AC
  Filled 2019-07-02: qty 5

## 2019-07-02 MED ORDER — ROCURONIUM BROMIDE 10 MG/ML (PF) SYRINGE
PREFILLED_SYRINGE | INTRAVENOUS | Status: AC
  Filled 2019-07-02: qty 10

## 2019-07-02 MED ORDER — ROCURONIUM BROMIDE 50 MG/5ML IV SOSY
PREFILLED_SYRINGE | INTRAVENOUS | Status: DC | PRN
  Administered 2019-07-02: 100 mg via INTRAVENOUS
  Administered 2019-07-02: 30 mg via INTRAVENOUS
  Administered 2019-07-02: 50 mg via INTRAVENOUS

## 2019-07-02 MED ORDER — SUGAMMADEX SODIUM 200 MG/2ML IV SOLN
INTRAVENOUS | Status: DC | PRN
  Administered 2019-07-02: 200 mg via INTRAVENOUS

## 2019-07-02 MED ORDER — METHOCARBAMOL 1000 MG/10ML IJ SOLN
500.0000 mg | Freq: Four times a day (QID) | INTRAVENOUS | Status: DC
  Filled 2019-07-02 (×17): qty 5

## 2019-07-02 MED ORDER — DEXAMETHASONE SODIUM PHOSPHATE 10 MG/ML IJ SOLN
INTRAMUSCULAR | Status: DC | PRN
  Administered 2019-07-02: 10 mg via INTRAVENOUS

## 2019-07-02 MED ORDER — MIDAZOLAM HCL 5 MG/5ML IJ SOLN
INTRAMUSCULAR | Status: DC | PRN
  Administered 2019-07-02: 2 mg via INTRAVENOUS

## 2019-07-02 MED ORDER — MIDAZOLAM HCL 2 MG/2ML IJ SOLN
2.0000 mg | Freq: Once | INTRAMUSCULAR | Status: AC
  Administered 2019-07-02: 2 mg via INTRAVENOUS

## 2019-07-02 MED ORDER — IOHEXOL 300 MG/ML  SOLN
100.0000 mL | Freq: Once | INTRAMUSCULAR | Status: AC | PRN
  Administered 2019-07-02: 100 mL via INTRAVENOUS

## 2019-07-02 MED ORDER — METHOCARBAMOL 500 MG PO TABS: 500.0000 mg | ORAL_TABLET | Freq: Four times a day (QID) | ORAL | Status: DC | PRN

## 2019-07-02 MED ORDER — PROPOFOL 10 MG/ML IV BOLUS
INTRAVENOUS | Status: AC | PRN
  Administered 2019-07-02: 60 mg via INTRAVENOUS

## 2019-07-02 MED ORDER — DEXAMETHASONE SODIUM PHOSPHATE 10 MG/ML IJ SOLN
INTRAMUSCULAR | Status: AC
  Filled 2019-07-02: qty 1

## 2019-07-02 MED ORDER — ONDANSETRON HCL 4 MG PO TABS: 4.0000 mg | ORAL_TABLET | Freq: Four times a day (QID) | ORAL | Status: DC | PRN

## 2019-07-02 MED ORDER — OXYCODONE-ACETAMINOPHEN 5-325 MG PO TABS
1.0000 | ORAL_TABLET | Freq: Once | ORAL | Status: AC
  Administered 2019-07-02: 1 via ORAL
  Filled 2019-07-02: qty 1

## 2019-07-02 MED ORDER — ONDANSETRON HCL 4 MG/2ML IJ SOLN: 4.0000 mg | Freq: Four times a day (QID) | INTRAMUSCULAR | Status: DC | PRN

## 2019-07-02 MED ORDER — ONDANSETRON HCL 4 MG/2ML IJ SOLN
INTRAMUSCULAR | Status: AC
  Filled 2019-07-02: qty 2

## 2019-07-02 MED ORDER — KETAMINE HCL 10 MG/ML IJ SOLN
INTRAMUSCULAR | Status: AC | PRN
  Administered 2019-07-02: 60 mg via INTRAVENOUS

## 2019-07-02 MED ORDER — CEFAZOLIN SODIUM-DEXTROSE 2-4 GM/100ML-% IV SOLN
2.0000 g | Freq: Four times a day (QID) | INTRAVENOUS | Status: AC
  Administered 2019-07-02 – 2019-07-03 (×2): 2 g via INTRAVENOUS
  Filled 2019-07-02 (×2): qty 100

## 2019-07-02 MED ORDER — LACTATED RINGERS IV SOLN
INTRAVENOUS | Status: DC | PRN
  Administered 2019-07-02 (×2): via INTRAVENOUS

## 2019-07-02 MED ORDER — OXYCODONE HCL 5 MG PO TABS
5.0000 mg | ORAL_TABLET | ORAL | Status: DC | PRN
  Administered 2019-07-02: 10:00:00 15 mg via ORAL
  Filled 2019-07-02: qty 3

## 2019-07-02 MED ORDER — METOCLOPRAMIDE HCL 5 MG/ML IJ SOLN: 5.0000 mg | Freq: Three times a day (TID) | INTRAMUSCULAR | Status: DC | PRN

## 2019-07-02 MED ORDER — METHOCARBAMOL 1000 MG/10ML IJ SOLN
500.0000 mg | Freq: Four times a day (QID) | INTRAVENOUS | Status: DC | PRN
  Filled 2019-07-02: qty 5

## 2019-07-02 MED ORDER — PROPOFOL 10 MG/ML IV BOLUS
INTRAVENOUS | Status: DC | PRN
  Administered 2019-07-02: 130 mg via INTRAVENOUS

## 2019-07-02 MED ORDER — OXYCODONE HCL 5 MG PO TABS
10.0000 mg | ORAL_TABLET | ORAL | Status: DC | PRN
  Administered 2019-07-03: 10 mg via ORAL

## 2019-07-02 MED ORDER — METHOCARBAMOL 500 MG PO TABS
750.0000 mg | ORAL_TABLET | Freq: Four times a day (QID) | ORAL | Status: DC
  Administered 2019-07-02 – 2019-07-06 (×14): 750 mg via ORAL
  Filled 2019-07-02 (×14): qty 2

## 2019-07-02 MED ORDER — CEFAZOLIN SODIUM-DEXTROSE 2-4 GM/100ML-% IV SOLN
INTRAVENOUS | Status: AC
  Filled 2019-07-02: qty 100

## 2019-07-02 MED ORDER — ACETAMINOPHEN 325 MG PO TABS: 325.0000 mg | ORAL_TABLET | Freq: Four times a day (QID) | ORAL | Status: DC | PRN

## 2019-07-02 MED ORDER — SODIUM CHLORIDE 0.9 % IV BOLUS
1000.0000 mL | Freq: Once | INTRAVENOUS | Status: AC
  Administered 2019-07-02: 1000 mL via INTRAVENOUS

## 2019-07-02 MED ORDER — HYDROMORPHONE HCL 1 MG/ML IJ SOLN: 1.0000 mg | Freq: Once | INTRAMUSCULAR | Status: DC

## 2019-07-02 MED ORDER — HYDROMORPHONE HCL 1 MG/ML IJ SOLN
0.5000 mg | INTRAMUSCULAR | Status: DC | PRN
  Administered 2019-07-02 – 2019-07-06 (×14): 1 mg via INTRAVENOUS
  Filled 2019-07-02 (×14): qty 1

## 2019-07-02 MED ORDER — PROPOFOL 10 MG/ML IV BOLUS
1.0000 mg/kg | Freq: Once | INTRAVENOUS | Status: DC
  Filled 2019-07-02: qty 20

## 2019-07-02 MED ORDER — LACTATED RINGERS IV SOLN
INTRAVENOUS | Status: DC
  Administered 2019-07-02 – 2019-07-03 (×2): via INTRAVENOUS

## 2019-07-02 MED ORDER — ENOXAPARIN SODIUM 40 MG/0.4ML ~~LOC~~ SOLN
40.0000 mg | SUBCUTANEOUS | Status: DC
  Administered 2019-07-03 – 2019-07-06 (×4): 40 mg via SUBCUTANEOUS
  Filled 2019-07-02 (×4): qty 0.4

## 2019-07-02 MED ORDER — METOCLOPRAMIDE HCL 5 MG PO TABS: 5.0000 mg | ORAL_TABLET | Freq: Three times a day (TID) | ORAL | Status: DC | PRN

## 2019-07-02 MED ORDER — POLYETHYLENE GLYCOL 3350 17 G PO PACK
17.0000 g | PACK | Freq: Every day | ORAL | Status: DC
  Filled 2019-07-02 (×4): qty 1

## 2019-07-02 MED ORDER — KETAMINE HCL 50 MG/5ML IJ SOSY
0.3000 mg/kg | PREFILLED_SYRINGE | Freq: Once | INTRAMUSCULAR | Status: AC | PRN
  Administered 2019-07-02: 34 mg via INTRAVENOUS
  Filled 2019-07-02: qty 5

## 2019-07-02 MED ORDER — LIDOCAINE 2% (20 MG/ML) 5 ML SYRINGE
INTRAMUSCULAR | Status: DC | PRN
  Administered 2019-07-02: 100 mg via INTRAVENOUS

## 2019-07-02 MED ORDER — FENTANYL CITRATE (PF) 100 MCG/2ML IJ SOLN
25.0000 ug | INTRAMUSCULAR | Status: DC | PRN
  Administered 2019-07-02 (×2): 50 ug via INTRAVENOUS

## 2019-07-02 MED ORDER — FENTANYL CITRATE (PF) 100 MCG/2ML IJ SOLN
50.0000 ug | Freq: Once | INTRAMUSCULAR | Status: AC
  Administered 2019-07-02: 03:00:00 50 ug via INTRAVENOUS
  Filled 2019-07-02: qty 2

## 2019-07-02 MED ORDER — PHENOL 1.4 % MT LIQD: 1.0000 | OROMUCOSAL | Status: DC | PRN

## 2019-07-02 MED ORDER — ACETAMINOPHEN 500 MG PO TABS
1000.0000 mg | ORAL_TABLET | Freq: Three times a day (TID) | ORAL | Status: DC
  Administered 2019-07-02 – 2019-07-06 (×12): 1000 mg via ORAL
  Filled 2019-07-02 (×12): qty 2

## 2019-07-02 MED ORDER — ONDANSETRON HCL 4 MG/2ML IJ SOLN
4.0000 mg | Freq: Four times a day (QID) | INTRAMUSCULAR | Status: DC | PRN
  Administered 2019-07-05: 12:00:00 4 mg via INTRAVENOUS
  Filled 2019-07-02: qty 2

## 2019-07-02 MED ORDER — ALUM & MAG HYDROXIDE-SIMETH 200-200-20 MG/5ML PO SUSP: 30.0000 mL | ORAL | Status: DC | PRN

## 2019-07-02 MED ORDER — FENTANYL CITRATE (PF) 100 MCG/2ML IJ SOLN
INTRAMUSCULAR | Status: DC | PRN
  Administered 2019-07-02 (×2): 50 ug via INTRAVENOUS
  Administered 2019-07-02: 150 ug via INTRAVENOUS
  Administered 2019-07-02 (×2): 50 ug via INTRAVENOUS

## 2019-07-02 MED ORDER — PROPOFOL 10 MG/ML IV BOLUS
INTRAVENOUS | Status: AC
  Filled 2019-07-02: qty 20

## 2019-07-02 MED ORDER — FENTANYL CITRATE (PF) 100 MCG/2ML IJ SOLN: 25.0000 ug | INTRAMUSCULAR | Status: DC

## 2019-07-02 MED ORDER — CEFAZOLIN SODIUM-DEXTROSE 1-4 GM/50ML-% IV SOLN
INTRAVENOUS | Status: AC
  Filled 2019-07-02: qty 50

## 2019-07-02 MED ORDER — DIPHENHYDRAMINE HCL 12.5 MG/5ML PO ELIX: 12.5000 mg | ORAL_SOLUTION | ORAL | Status: DC | PRN

## 2019-07-02 MED ORDER — 0.9 % SODIUM CHLORIDE (POUR BTL) OPTIME
TOPICAL | Status: DC | PRN
  Administered 2019-07-02: 18:00:00 1000 mL

## 2019-07-02 MED ORDER — KETAMINE HCL 50 MG/5ML IJ SOSY
1.0000 mg/kg | PREFILLED_SYRINGE | Freq: Once | INTRAMUSCULAR | Status: DC
  Filled 2019-07-02: qty 15

## 2019-07-02 MED ORDER — DOCUSATE SODIUM 100 MG PO CAPS
100.0000 mg | ORAL_CAPSULE | Freq: Two times a day (BID) | ORAL | Status: DC
  Administered 2019-07-02 – 2019-07-06 (×6): 100 mg via ORAL
  Filled 2019-07-02 (×8): qty 1

## 2019-07-02 SURGICAL SUPPLY — 41 items
BRUSH SCRUB EZ PLAIN DRY (MISCELLANEOUS) ×6 IMPLANT
COVER PERINEAL POST (MISCELLANEOUS) ×3 IMPLANT
COVER SURGICAL LIGHT HANDLE (MISCELLANEOUS) ×6 IMPLANT
COVER WAND RF STERILE (DRAPES) ×3 IMPLANT
DRAPE C-ARMOR (DRAPES) ×3 IMPLANT
DRAPE HALF SHEET 40X57 (DRAPES) ×4 IMPLANT
DRAPE ORTHO SPLIT 77X108 STRL (DRAPES) ×6
DRAPE SURG ORHT 6 SPLT 77X108 (DRAPES) IMPLANT
DRSG MEPILEX BORDER 4X8 (GAUZE/BANDAGES/DRESSINGS) ×2 IMPLANT
ELECT BLADE 4.0 EZ CLEAN MEGAD (MISCELLANEOUS) ×3
ELECT REM PT RETURN 9FT ADLT (ELECTROSURGICAL) ×3
ELECTRODE BLDE 4.0 EZ CLN MEGD (MISCELLANEOUS) IMPLANT
ELECTRODE REM PT RTRN 9FT ADLT (ELECTROSURGICAL) ×1 IMPLANT
GLOVE BIO SURGEON STRL SZ7.5 (GLOVE) ×3 IMPLANT
GLOVE BIO SURGEON STRL SZ8 (GLOVE) ×3 IMPLANT
GLOVE BIOGEL PI IND STRL 7.0 (GLOVE) IMPLANT
GLOVE BIOGEL PI IND STRL 7.5 (GLOVE) ×1 IMPLANT
GLOVE BIOGEL PI IND STRL 8 (GLOVE) ×1 IMPLANT
GLOVE BIOGEL PI INDICATOR 7.0 (GLOVE) ×4
GLOVE BIOGEL PI INDICATOR 7.5 (GLOVE) ×2
GLOVE BIOGEL PI INDICATOR 8 (GLOVE) ×2
GOWN STRL REUS W/ TWL LRG LVL3 (GOWN DISPOSABLE) ×2 IMPLANT
GOWN STRL REUS W/ TWL XL LVL3 (GOWN DISPOSABLE) ×1 IMPLANT
GOWN STRL REUS W/TWL LRG LVL3 (GOWN DISPOSABLE) ×6
GOWN STRL REUS W/TWL XL LVL3 (GOWN DISPOSABLE) ×3
KIT BASIN OR (CUSTOM PROCEDURE TRAY) ×3 IMPLANT
KIT TURNOVER KIT B (KITS) ×3 IMPLANT
NS IRRIG 1000ML POUR BTL (IV SOLUTION) ×3 IMPLANT
PACK GENERAL/GYN (CUSTOM PROCEDURE TRAY) ×3 IMPLANT
PAD ARMBOARD 7.5X6 YLW CONV (MISCELLANEOUS) ×6 IMPLANT
STAPLER VISISTAT 35W (STAPLE) ×3 IMPLANT
SUT ETHILON 2 0 PSLX (SUTURE) ×2 IMPLANT
SUT VIC AB 0 CT1 27 (SUTURE) ×3
SUT VIC AB 0 CT1 27XBRD ANBCTR (SUTURE) ×1 IMPLANT
SUT VIC AB 1 CT1 27 (SUTURE) ×9
SUT VIC AB 1 CT1 27XBRD ANBCTR (SUTURE) ×1 IMPLANT
SUT VIC AB 2-0 CT1 27 (SUTURE) ×6
SUT VIC AB 2-0 CT1 TAPERPNT 27 (SUTURE) ×1 IMPLANT
TOWEL GREEN STERILE (TOWEL DISPOSABLE) ×6 IMPLANT
TOWEL GREEN STERILE FF (TOWEL DISPOSABLE) ×3 IMPLANT
TRAY FOLEY W/BAG SLVR 14FR (SET/KITS/TRAYS/PACK) ×2 IMPLANT

## 2019-07-02 NOTE — Anesthesia Postprocedure Evaluation (Signed)
Anesthesia Post Note  Patient: Anaih Boskovich  Procedure(s) Performed: ANTERIOR HIP APPROACH ARTHROTOMY WITH REMOVAL LOOSE FRAGMENTS (Left Hip)     Patient location during evaluation: PACU Anesthesia Type: General Level of consciousness: awake Pain management: pain level controlled Vital Signs Assessment: post-procedure vital signs reviewed and stable Respiratory status: spontaneous breathing Cardiovascular status: stable Postop Assessment: no apparent nausea or vomiting Anesthetic complications: no    Last Vitals:  Vitals:   07/02/19 2020 07/02/19 2040  BP: 117/68   Pulse: 86 92  Resp: (!) 22 (!) 25  Temp:  36.7 C  SpO2: 98% (!) 88%    Last Pain:  Vitals:   07/02/19 2040  TempSrc:   PainSc: 8                  Carlisha Wisler

## 2019-07-02 NOTE — ED Provider Notes (Signed)
  Physical Exam  BP 122/77   Pulse 94   Temp 98.2 F (36.8 C) (Oral)   Resp 16   Ht 5\' 6"  (1.676 m)   Wt 113.4 kg   SpO2 96%   BMI 40.35 kg/m   Physical Exam  ED Course/Procedures   Clinical Course as of Jul 01 1044  Thu Jul 02, 2019  [id number] Hip was successfully reduced. CT scan of the abdomen and pelvis ordered given that patient had a high risk injury, and there is a possibility of internal injuries.   [AN]    Clinical Course User Index [AN] Varney Biles, MD    Procedures  MDM  Patient has L hip fracture. Ortho was going to see.   10:47 AM Ortho to admit.     Drenda Freeze, MD 07/02/19 [phone]
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
## 2019-07-02 NOTE — Anesthesia Procedure Notes (Signed)
Procedure Name: Intubation Date/Time: 07/02/2019 4:59 PM Performed by: Lance Coon, CRNA Pre-anesthesia Checklist: Patient identified, Emergency Drugs available, Suction available, Patient being monitored and Timeout performed Patient Re-evaluated:Patient Re-evaluated prior to induction Oxygen Delivery Method: Circle system utilized Preoxygenation: Pre-oxygenation with 100% oxygen Induction Type: IV induction Ventilation: Mask ventilation without difficulty Laryngoscope Size: Miller and 3 Grade View: Grade I Tube type: Oral Tube size: 7.0 mm Number of attempts: 1 Airway Equipment and Method: Stylet Placement Confirmation: ETT inserted through vocal cords under direct vision,  positive ETCO2 and breath sounds checked- equal and bilateral Secured at: 22 cm Tube secured with: Tape Dental Injury: Teeth and Oropharynx as per pre-operative assessment

## 2019-07-02 NOTE — Transfer of Care (Signed)
Immediate Anesthesia Transfer of Care Note  Patient: Stevi Calk  Procedure(s) Performed: ANTERIOR HIP APPROACH ARTHROTOMY WITH REMOVAL LOOSE FRAGMENTS (Left Hip)  Patient Location: PACU  Anesthesia Type:General  Level of Consciousness: sedated, drowsy, pateint uncooperative and responds to stimulation  Airway & Oxygen Therapy: Patient Spontanous Breathing and Patient connected to nasal cannula oxygen  Post-op Assessment: Report given to RN, Post -op Vital signs reviewed and stable and Patient moving all extremities X 4  Post vital signs: Reviewed and stable  Last Vitals:  Vitals Value Taken Time  BP    Temp    Pulse 96 07/02/19 1949  Resp 54 07/02/19 1949  SpO2 91 % 07/02/19 1949  Vitals shown include unvalidated device data.  Last Pain:  Vitals:   07/02/19 1013  TempSrc:   PainSc: 9          Complications: No apparent anesthesia complications

## 2019-07-02 NOTE — Progress Notes (Signed)
Orthopedic Tech Progress Note Patient Details:  Marie Barnes [birth year] [id number]  Ortho Devices Type of Ortho Device: Knee Immobilizer Ortho Device/Splint Interventions: Adjustment, Application, Ordered   Post Interventions Patient Tolerated: Well   [name] T 07/02/2019, 4:19 AM

## 2019-07-03 ENCOUNTER — Encounter (HOSPITAL_COMMUNITY): Payer: Self-pay | Admitting: Orthopedic Surgery

## 2019-07-03 DIAGNOSIS — S73015A Posterior dislocation of left hip, initial encounter: Secondary | ICD-10-CM | POA: Diagnosis present

## 2019-07-03 DIAGNOSIS — F17200 Nicotine dependence, unspecified, uncomplicated: Secondary | ICD-10-CM

## 2019-07-03 HISTORY — DX: Nicotine dependence, unspecified, uncomplicated: F17.200

## 2019-07-03 HISTORY — DX: Posterior dislocation of left hip, initial encounter: S73.015A

## 2019-07-03 LAB — HEPATITIS PANEL, ACUTE
HCV Ab: <0.1 s/co ratio (ref 0.0–0.9)
Hep A IgM: NEGATIVE
Hep B C IgM: NEGATIVE
Hepatitis B Surface Ag: NEGATIVE

## 2019-07-03 LAB — BASIC METABOLIC PANEL
Anion gap: 9 (ref 5–15)
BUN: 5 mg/dL — ABNORMAL LOW (ref 6–20)
CO2: 22 mmol/L (ref 22–32)
Calcium: 8 mg/dL — ABNORMAL LOW (ref 8.9–10.3)
Chloride: 107 mmol/L (ref 98–111)
Creatinine, Ser: 0.76 mg/dL (ref 0.44–1.00)
GFR calc Af Amer: >60 mL/min (ref >60)
GFR calc non Af Amer: >60 mL/min (ref >60)
Glucose, Bld: 141 mg/dL — ABNORMAL HIGH (ref 70–99)
Potassium: 3.4 mmol/L — ABNORMAL LOW (ref 3.5–5.1)
Sodium: 138 mmol/L (ref 135–145)

## 2019-07-03 LAB — CBC
HCT: 35.2 % — ABNORMAL LOW (ref 36.0–46.0)
Hemoglobin: 11.4 g/dL — ABNORMAL LOW (ref 12.0–15.0)
MCH: 31.8 pg (ref 26.0–34.0)
MCHC: 32.4 g/dL (ref 30.0–36.0)
MCV: 98.3 fL (ref 80.0–100.0)
Platelets: 217 10*3/uL (ref 150–400)
RBC: 3.58 MIL/uL — ABNORMAL LOW (ref 3.87–5.11)
RDW: 13.8 % (ref 11.5–15.5)
WBC: 17.4 10*3/uL — ABNORMAL HIGH (ref 4.0–10.5)
nRBC: 0 % (ref 0.0–0.2)

## 2019-07-03 LAB — HIV ANTIBODY (ROUTINE TESTING W REFLEX): HIV Screen 4th Generation wRfx: NONREACTIVE

## 2019-07-03 MED ORDER — ACETAMINOPHEN 500 MG PO TABS: 500.0000 mg | ORAL_TABLET | Freq: Three times a day (TID) | ORAL | 0 refills | Status: DC | PRN

## 2019-07-03 MED ORDER — METHOCARBAMOL 500 MG PO TABS: 500.0000 mg | ORAL_TABLET | Freq: Three times a day (TID) | ORAL | 0 refills | Status: DC | PRN

## 2019-07-03 MED ORDER — DOCUSATE SODIUM 100 MG PO CAPS: 100.0000 mg | ORAL_CAPSULE | Freq: Two times a day (BID) | ORAL | 0 refills | Status: DC

## 2019-07-03 MED ORDER — OXYCODONE-ACETAMINOPHEN 7.5-325 MG PO TABS: 1.0000 | ORAL_TABLET | Freq: Four times a day (QID) | ORAL | 0 refills | Status: DC | PRN

## 2019-07-03 MED ORDER — OXYCODONE HCL 5 MG PO TABS
10.0000 mg | ORAL_TABLET | Freq: Once | ORAL | Status: AC
  Administered 2019-07-03: 10 mg via ORAL
  Filled 2019-07-03: qty 2

## 2019-07-03 MED ORDER — PNEUMOCOCCAL VAC POLYVALENT 25 MCG/0.5ML IJ INJ
0.5000 mL | INJECTION | INTRAMUSCULAR | Status: AC
  Administered 2019-07-05: 0.5 mL via INTRAMUSCULAR
  Filled 2019-07-03: qty 0.5

## 2019-07-03 MED ORDER — OXYCODONE HCL 5 MG PO TABS
10.0000 mg | ORAL_TABLET | ORAL | Status: DC | PRN
  Administered 2019-07-03 – 2019-07-06 (×16): 20 mg via ORAL
  Filled 2019-07-03 (×16): qty 4
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
## 2019-07-03 NOTE — Progress Notes (Signed)
Response to Spiritual Consult.  Pt requested prayer.  Initiated a relationship of care and support by first facilitating Pt's story of how she came to be at hospital.  Prayed aloud with Pt.  Pt asked if hospital had a charger for her dead phone.  Referred the request to nurse during rounds.  Pt appreciative of visit.  Plan to follow up on next rounds.

## 2019-07-03 NOTE — TOC Progression Note (Signed)
Transition of Care [facility]) - Progression Note    Patient Details  Name: Marie Barnes MRN: [id number] Date of Birth: Aug 01, 1976  Transition of Care [facility]) CM/SW [place], [place] Phone Number: 07/03/2019, 3:16 PM  Clinical Narrative:    Financial counselor Rayetta Humphrey contacted for Medicaid screening.      Barriers to Discharge: Continued Medical Work up  Expected Discharge Plan and Services Living arrangements for the past 2 months: Single Family Home   Social Determinants of Health (SDOH) Interventions    Readmission Risk Interventions No flowsheet data found.

## 2019-07-03 NOTE — Progress Notes (Signed)
Orthopedic Tech Progress Note Patient Details:  Marie Barnes 12-09-1975 [id number] Patient is on a bed that has old frame still on it. So I have a bed in the hallway in front of her room setup with trapeze for patient. Once therapy comes in to Move patient to chair from bed, RN/NT will switch out beds. Patient ID: Marie Barnes, female   DOB: 05-28-1976, 43 y.o.   MRN: [id number]   Janit Pagan 07/03/2019, 7:36 AM
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
## 2019-07-03 NOTE — Care Management (Cosign Needed)
    Durable Medical Equipment  (From admission, onward)         Start     Ordered   07/03/19 1513  For home use only DME lightweight manual wheelchair with seat cushion  Once    Comments: Patient suffers from Left femoral head fracture dislocation with incarcerated intra-articular fragments s/p arthrotomy and I&D             TDWB L leg with assistance which impairs their ability to perform daily activities like ambulating  in the home.  A cane will not resolve  issue with performing activities of daily living. A wheelchair will allow patient to safely perform daily activities. Patient is not able to propel themselves in the home using a standard weight wheelchair due to Left femoral head fracture dislocation with incarcerated intra-articular fragments s/p arthrotomy and I&D             TDWB L leg with assistance . Patient can self propel in the lightweight wheelchair. Length of need 6 months  Accessories: elevating leg rests (ELRs), wheel locks, extensions and anti-tippers.  Seat and back cushions   07/03/19 1513   07/03/19 1512  For home use only DME 3 n 1  Once     07/03/19 1513   07/03/19 1511  For home use only DME Walker rolling  Once    Question:  Patient needs a walker to treat with the following condition  Answer:  Closed left femoral fracture ([place])   07/03/19 1513
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
## 2019-07-03 NOTE — Discharge Instructions (Signed)
Orthopaedic Trauma Service Discharge Instructions   General Discharge Instructions  WEIGHT BEARING STATUS: Touchdown weightbearing left leg x 6 weeks. Use walker   RANGE OF MOTION/ACTIVITY: posterior hip precautions left hip x 12 weeks   Wound Care: you can follow up at the office on 07/08/2019 and we can remove dressing or you can remove dressing on 07/08/2019 and place a clean gauze and tape over the incision and follow the wound care instructions below and then follow up at the office on 07/15/2019  Discharge Wound Care Instructions  Do NOT apply any ointments, solutions or lotions to pin sites or surgical wounds.  These prevent needed drainage and even though solutions like hydrogen peroxide kill bacteria, they also damage cells lining the pin sites that help fight infection.  Applying lotions or ointments can keep the wounds moist and can cause them to breakdown and open up as well. This can increase the risk for infection. When in doubt call the office.  Surgical incisions should be dressed daily.  If any drainage is noted, use one layer of adaptic, then gauze and 4x4's   Once the incision is completely dry and without drainage, it may be left open to air out.  Showering may begin 36-48 hours later.  Cleaning gently with soap and water.  Traumatic wounds should be dressed daily as well.    One layer of adaptic, gauze, Kerlix, then ace wrap.  The adaptic can be discontinued once the draining has ceased    If you have a wet to dry dressing: wet the gauze with saline the squeeze as much saline out so the gauze is moist (not soaking wet), place moistened gauze over wound, then place a dry gauze over the moist one, followed by Kerlix wrap, then ace wrap.   Diet: as you were eating previously.  Can use over the counter stool softeners and bowel preparations, such as Miralax, to help with bowel movements.  Narcotics can be constipating.  Be sure to drink plenty of fluids  PAIN MEDICATION  USE AND EXPECTATIONS  You have likely been given narcotic medications to help control your pain.  After a traumatic event that results in an fracture (broken bone) with or without surgery, it is ok to use narcotic pain medications to help control one's pain.  We understand that everyone responds to pain differently and each individual patient will be evaluated on a regular basis for the continued need for narcotic medications. Ideally, narcotic medication use should last no more than 6-8 weeks (coinciding with fracture healing).   As a patient it is your responsibility as well to monitor narcotic medication use and report the amount and frequency you use these medications when you come to your office visit.   We would also advise that if you are using narcotic medications, you should take a dose prior to therapy to maximize you participation.  IF YOU ARE ON NARCOTIC MEDICATIONS IT IS NOT PERMISSIBLE TO OPERATE A MOTOR VEHICLE (MOTORCYCLE/CAR/TRUCK/MOPED) OR HEAVY MACHINERY DO NOT MIX NARCOTICS WITH OTHER CNS (CENTRAL NERVOUS SYSTEM) DEPRESSANTS SUCH AS ALCOHOL   STOP SMOKING OR USING NICOTINE PRODUCTS!!!!  As discussed nicotine severely impairs your body's ability to heal surgical and traumatic wounds but also impairs bone healing.  Wounds and bone heal by forming microscopic blood vessels (angiogenesis) and nicotine is a vasoconstrictor (essentially, shrinks blood vessels).  Therefore, if vasoconstriction occurs to these microscopic blood vessels they essentially disappear and are unable to deliver necessary nutrients to the healing  tissue.  This is one modifiable factor that you can do to dramatically increase your chances of healing your injury.    (This means no smoking, no nicotine gum, patches, etc)  DO NOT USE NONSTEROIDAL ANTI-INFLAMMATORY DRUGS (NSAID'S)  Using products such as Advil (ibuprofen), Aleve (naproxen), Motrin (ibuprofen) for additional pain control during fracture healing can delay  and/or prevent the healing response.  If you would like to take over the counter (OTC) medication, Tylenol (acetaminophen) is ok.  However, some narcotic medications that are given for pain control contain acetaminophen as well. Therefore, you should not exceed more than 4000 mg of tylenol in a day if you do not have liver disease.  Also note that there are may OTC medicines, such as cold medicines and allergy medicines that my contain tylenol as well.  If you have any questions about medications and/or interactions please ask your doctor/PA or your pharmacist.      ICE AND ELEVATE INJURED/OPERATIVE EXTREMITY  Using ice and elevating the injured extremity above your heart can help with swelling and pain control.  Icing in a pulsatile fashion, such as 20 minutes on and 20 minutes off, can be followed.    Do not place ice directly on skin. Make sure there is a barrier between to skin and the ice pack.    Using frozen items such as frozen peas works well as the conform nicely to the are that needs to be iced.  USE AN ACE WRAP OR TED HOSE FOR SWELLING CONTROL  In addition to icing and elevation, Ace wraps or TED hose are used to help limit and resolve swelling.  It is recommended to use Ace wraps or TED hose until you are informed to stop.    When using Ace Wraps start the wrapping distally (farthest away from the body) and wrap proximally (closer to the body)   Example: If you had surgery on your leg or thing and you do not have a splint on, start the ace wrap at the toes and work your way up to the thigh        If you had surgery on your upper extremity and do not have a splint on, start the ace wrap at your fingers and work your way up to the upper arm  IF YOU ARE IN A SPLINT OR CAST DO NOT [place]   If your splint gets wet for any reason please contact the office immediately. You may shower in your splint or cast as long as you keep it dry.  This can be done by wrapping in a cast  cover or garbage back (or similar)  Do Not stick any thing down your splint or cast such as pencils, money, or hangers to try and scratch yourself with.  If you feel itchy take benadryl as prescribed on the bottle for itching  IF YOU ARE IN A CAM BOOT (BLACK BOOT)  You may remove boot periodically. Perform daily dressing changes as noted below.  Wash the liner of the boot regularly and wear a sock when wearing the boot. It is recommended that you sleep in the boot until told otherwise    Call office for the following:  Temperature greater than 101F  Persistent nausea and vomiting  Severe uncontrolled pain  Redness, tenderness, or signs of infection (pain, swelling, redness, odor or green/yellow discharge around the site)  Difficulty breathing, headache or visual disturbances  Hives  Persistent dizziness or light-headedness  Extreme  fatigue  Any other questions or concerns you may have after discharge  In an emergency, call 911 or go to an Emergency Department at a nearby hospital    [place]: [phone]   VISIT OUR [place] INFORMATION: [email or web address]

## 2019-07-03 NOTE — Anesthesia Preprocedure Evaluation (Signed)
Anesthesia Evaluation  Patient identified by MRN, date of birth, ID band Patient awake    Reviewed: Allergy & Precautions, NPO status , Patient's Chart, lab work & pertinent test results  Airway Mallampati: I  TM Distance: >3 FB Neck ROM: Full    Dental   Pulmonary Current Smoker,    Pulmonary exam normal        Cardiovascular Normal cardiovascular exam     Neuro/Psych    GI/Hepatic   Endo/Other    Renal/GU      Musculoskeletal   Abdominal   Peds  Hematology   Anesthesia Other Findings   Reproductive/Obstetrics                             Anesthesia Physical Anesthesia Plan  ASA: II  Anesthesia Plan: General   Post-op Pain Management:    Induction: Intravenous  PONV Risk Score and Plan: 2  Airway Management Planned: Oral ETT  Additional Equipment:   Intra-op Plan:   Post-operative Plan: Extubation in OR  Informed Consent: I have reviewed the patients History and Physical, chart, labs and discussed the procedure including the risks, benefits and alternatives for the proposed anesthesia with the patient or authorized representative who has indicated his/her understanding and acceptance.       Plan Discussed with: CRNA and Surgeon  Anesthesia Plan Comments:         Anesthesia Quick Evaluation

## 2019-07-03 NOTE — Social Work (Signed)
CSW acknowledging consult for SNF placement. Current therapy recommendations are for SNF however pt will be a difficult placement due to not currently having any insurance. Also aware that pt has a consult for substance use and will meet with her to discuss resources as well.  Westley Hummer, MSW, [place] Work [phone]
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
## 2019-07-03 NOTE — Progress Notes (Signed)
Chaplain visited as a result of a consult.  Patient requested prayer and chaplain prayed for the patient.  The patient began to cry before and after prayer but declined to talk about what made them cry.  Chaplain assess that no follow-up is needed.  Brion Aliment Chaplain Resident For questions concerning this note please contact me by pager [phone]
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
## 2019-07-04 LAB — CBC
HCT: 34.5 % — ABNORMAL LOW (ref 36.0–46.0)
Hemoglobin: 10.9 g/dL — ABNORMAL LOW (ref 12.0–15.0)
MCH: 31.4 pg (ref 26.0–34.0)
MCHC: 31.6 g/dL (ref 30.0–36.0)
MCV: 99.4 fL (ref 80.0–100.0)
Platelets: 177 10*3/uL (ref 150–400)
RBC: 3.47 MIL/uL — ABNORMAL LOW (ref 3.87–5.11)
RDW: 13.8 % (ref 11.5–15.5)
WBC: 11.6 10*3/uL — ABNORMAL HIGH (ref 4.0–10.5)
nRBC: 0 % (ref 0.0–0.2)

## 2019-07-04 LAB — VITAMIN D 25 HYDROXY (VIT D DEFICIENCY, FRACTURES): Vit D, 25-Hydroxy: 20.3 ng/mL — ABNORMAL LOW (ref 30.0–100.0)
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
## 2019-07-04 NOTE — Progress Notes (Signed)
SPORTS MEDICINE AND JOINT REPLACEMENT  Lara Mulch, MD    Carlyon Shadow, PA-C [place], [place], [place]  [address]                             [phone]   PROGRESS NOTE  Subjective:  negative for Chest Pain  negative for Shortness of Breath  negative for Nausea/Vomiting   negative for Calf Pain  negative for Bowel Movement   Tolerating Diet: yes         Patient reports pain as 6 on 0-10 scale.    Objective: Vital signs in last 24 hours:    Patient Vitals for the past 24 hrs:  BP Temp Temp src Pulse Resp SpO2  07/04/19 0336 119/63 98.3 F (36.8 C) Oral 86 - 99 %  07/03/19 1923 105/64 98.3 F (36.8 C) Oral 92 - 98 %  07/03/19 1447 109/65 98.3 F (36.8 C) Oral 92 16 96 %  07/03/19 0830 (!) 109/59 98.5 F (36.9 C) Oral 92 16 100 %    @flow {1959:LAST@   Intake/Output from previous day:   09/04 0701 - 09/05 0700 In: 2488.2 [P.O.:960; I.V.:1528.2] Out: 1300 [Urine:1300]   Intake/Output this shift:   No intake/output data recorded.   Intake/Output      09/04 0701 - 09/05 0700 09/05 0701 - 09/06 0700   P.O. 960    I.V. (mL/kg) 1528.2 (13.5)    IV Piggyback     Total Intake(mL/kg) 2488.2 (21.9)    Urine (mL/kg/hr) 1300 (0.5)    Drains     Blood     Total Output 1300    Net +1188.2            LABORATORY DATA: Recent Labs    07/02/19 0237 07/03/19 0415 07/04/19 0337  WBC 10.5 17.4* 11.6*  HGB 12.9 11.4* 10.9*  HCT 40.6 35.2* 34.5*  PLT 247 217 177   Recent Labs    07/02/19 0237 07/03/19 0415  NA 138 138  K 4.1 3.4*  CL 108 107  CO2 16* 22  BUN 7 5*  CREATININE 1.04* 0.76  GLUCOSE 105* 141*  CALCIUM 8.4* 8.0*   Lab Results  Component Value Date   INR 0.99 10/01/2018    Examination:  General appearance: alert, cooperative and no distress Extremities: extremities normal, atraumatic, no cyanosis or edema  Wound Exam: clean, dry, intact   Drainage:  None: wound tissue dry  Motor Exam: Quadriceps and Hamstrings  Intact  Sensory Exam: Superficial Peroneal, Deep Peroneal and Tibial normal   Assessment:    2 Days Post-Op  Procedure(s) (LRB): ANTERIOR HIP APPROACH ARTHROTOMY WITH REMOVAL LOOSE FRAGMENTS (Left)  ADDITIONAL DIAGNOSIS:  Active Problems:   Fracture of femoral head ([facility])   Closed posterior dislocation of left hip ([facility])   Nicotine dependence     Plan: Physical Therapy as ordered Touch Down Weight Bearing (TDWB)  DVT Prophylaxis:  Lovenox  Patient in a lot of pain, nurse with patient at bedside. Hopeful for D/C home tomorrow but may need SNF placement if no improvement  Donia Ast 07/04/2019, 8:22 AM

## 2019-07-05 MED ORDER — SODIUM CHLORIDE 0.9 % IV BOLUS
500.0000 mL | Freq: Once | INTRAVENOUS | Status: AC
  Administered 2019-07-05: 17:00:00 500 mL via INTRAVENOUS

## 2019-07-05 MED ORDER — SODIUM CHLORIDE 0.9 % IV BOLUS
500.0000 mL | Freq: Once | INTRAVENOUS | Status: AC
  Administered 2019-07-05: 500 mL via INTRAVENOUS

## 2019-07-05 MED ORDER — DEXAMETHASONE SODIUM PHOSPHATE 10 MG/ML IJ SOLN
8.0000 mg | Freq: Once | INTRAMUSCULAR | Status: AC
  Administered 2019-07-05: 8 mg via INTRAVENOUS
  Filled 2019-07-05: qty 1
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
## 2019-07-05 NOTE — Plan of Care (Signed)
  Problem: Education: Goal: Verbalization of understanding the information provided (i.e., activity precautions, restrictions, etc) will improve Outcome: Progressing   Problem: Activity: Goal: Ability to ambulate and perform ADLs will improve Outcome: Progressing   Problem: Clinical Measurements: Goal: Postoperative complications will be avoided or minimized Outcome: Progressing   Problem: Self-Concept: Goal: Ability to maintain and perform role responsibilities to the fullest extent possible will improve Outcome: Progressing   Problem: Safety: Goal: Ability to remain free from injury will improve Outcome: Progressing

## 2019-07-05 NOTE — Progress Notes (Signed)
Subjective: 3 Days Post-Op Procedure(s) (LRB): ANTERIOR HIP APPROACH ARTHROTOMY WITH REMOVAL LOOSE FRAGMENTS (Left) Patient reports pain as 4 on 0-10 scale.    Objective: Vital signs in last 24 hours: Temp:  [98.3 F (36.8 C)-98.6 F (37 C)] 98.3 F (36.8 C) (09/06 0424) Pulse Rate:  [87-93] 87 (09/06 0424) Resp:  [18] 18 (09/06 0424) BP: (111-120)/(68-72) 111/68 (09/06 0424) SpO2:  [96 %-100 %] 96 % (09/06 0424)  Intake/Output from previous day: 09/05 0701 - 09/06 0700 In: 1155 [P.O.:720; I.V.:435] Out: 1650 [Urine:1650] Intake/Output this shift: Total I/O In: 720 [P.O.:720] Out: -   Recent Labs    07/03/19 0415 07/04/19 0337  HGB 11.4* 10.9*   Recent Labs    07/03/19 0415 07/04/19 0337  WBC 17.4* 11.6*  RBC 3.58* 3.47*  HCT 35.2* 34.5*  PLT 217 177   Recent Labs    07/03/19 0415  NA 138  K 3.4*  CL 107  CO2 22  BUN 5*  CREATININE 0.76  GLUCOSE 141*  CALCIUM 8.0*   No results for input(s): LABPT, INR in the last 72 hours.  ABD soft Neurovascular intact Sensation intact distally Incision: moderate drainage   Assessment/Plan: 3 Days Post-Op Procedure(s) (LRB): ANTERIOR HIP APPROACH ARTHROTOMY WITH REMOVAL LOOSE FRAGMENTS (Left) Up with therapy   Patient's mobility is much improved.  Her son may come up from [place] to stay with her    She may be able to discharge to home tomorrow if her mobility continues to improve.  Otherwise she will need to go to a SNF   Baneen Wieseler J Toran Murch 07/05/2019, 2:56 PM
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
## 2019-07-06 NOTE — Progress Notes (Signed)
Discharge instructions addressed;pt in stable condition;not in respiratory distress; required equipments to be delivered home since it's a holiday today per case management. Pt to be picked up by a family friend at [employer] entrance
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
## 2019-07-06 NOTE — TOC Progression Note (Signed)
Transition of Care [facility]) - Progression Note    Patient Details  Name: Marie Barnes MRN: [id number] Date of Birth: 1976-10-03  Transition of Care [facility]) CM/SW Contact  Bartholomew Crews, RN Phone Number: [phone] 07/06/2019, 2:39 PM  Clinical Narrative:    Spoke with liaison for [facility] about possible charity for [facility] PT - pending approval. Patient needs charity DME - RW, 3N1 - [employer] is not available today, will follow up tomorrow 9/8. Patient to follow up for primary care and hospital f/u at [facility] - noted provider info placed on AVS per previous CM. Medications called into to [facility] in [place]. No other transition of care needs identified.      Barriers to Discharge: Continued Medical Work up  Expected Discharge Plan and Services         Living arrangements for the past 2 months: Single Family Home Expected Discharge Date: 07/06/19                                     Social Determinants of Health (SDOH) Interventions    Readmission Risk Interventions No flowsheet data found.

## 2019-07-06 NOTE — Progress Notes (Signed)
Pt was given the option if she could stay for one more night and wait for her equipments due to the holiday but pt opted to go home today;provided with  the rolling walker and notified  her that the rest of the recommended equipments will be delivered home.

## 2019-07-07 LAB — CALCITRIOL (1,25 DI-OH VIT D): Vit D, 1,25-Dihydroxy: 41.4 pg/mL (ref 19.9–79.3)

## 2019-07-07 NOTE — Progress Notes (Signed)
Post discharge call received on 07/07/19 from [facility] at Dr. Carlean Jews office-regarding pt's DME needs. Pt was discharged on 07/06/19 with orders for RW, 3n1 and wheelchair. [employer] was closed due to holiday yesterday. Call made to [facility] with [employer] regarding DME needs- will f/u on orders and once processed can have DME delivered to pt's home. MD will need to co-sign narrative note for wheelchair- have spoken with Gywnn about this and she will have MD follow up for signature. [employer] will call patient regarding delivery.

## 2019-07-08 ENCOUNTER — Other Ambulatory Visit: Payer: Self-pay | Admitting: Orthopedic Surgery

## 2019-07-08 DIAGNOSIS — M7989 Other specified soft tissue disorders: Secondary | ICD-10-CM

## 2019-07-08 DIAGNOSIS — M79605 Pain in left leg: Secondary | ICD-10-CM

## 2019-07-09 ENCOUNTER — Ambulatory Visit (HOSPITAL_COMMUNITY)
Admission: RE | Admit: 2019-07-09 | Discharge: 2019-07-09 | Disposition: A | Discharge status: Home / Self Care | Payer: Self-pay | Source: Ambulatory Visit | Attending: Orthopedic Surgery | Admitting: Orthopedic Surgery

## 2019-07-09 ENCOUNTER — Other Ambulatory Visit: Payer: Self-pay

## 2019-07-09 DIAGNOSIS — M79605 Pain in left leg: Secondary | ICD-10-CM | POA: Insufficient documentation

## 2019-07-09 DIAGNOSIS — M7989 Other specified soft tissue disorders: Secondary | ICD-10-CM | POA: Insufficient documentation

## 2019-07-22 ENCOUNTER — Encounter (HOSPITAL_COMMUNITY): Payer: Self-pay | Admitting: Orthopedic Surgery

## 2019-07-22 DIAGNOSIS — F10929 Alcohol use, unspecified with intoxication, unspecified: Secondary | ICD-10-CM | POA: Diagnosis present

## 2019-07-22 DIAGNOSIS — E559 Vitamin D deficiency, unspecified: Secondary | ICD-10-CM

## 2019-07-22 DIAGNOSIS — F1490 Cocaine use, unspecified, uncomplicated: Secondary | ICD-10-CM | POA: Diagnosis present

## 2019-07-22 DIAGNOSIS — F1210 Cannabis abuse, uncomplicated: Secondary | ICD-10-CM

## 2019-07-22 HISTORY — DX: Vitamin D deficiency, unspecified: E55.9

## 2019-07-22 HISTORY — DX: Cannabis abuse, uncomplicated: F12.10
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
## 2019-09-02 ENCOUNTER — Emergency Department (HOSPITAL_COMMUNITY)
Admission: EM | Admit: 2019-09-02 | Discharge: 2019-09-02 | Disposition: A | Discharge status: Home / Self Care | Payer: Medicaid Other | Attending: Emergency Medicine | Admitting: Emergency Medicine

## 2019-09-02 ENCOUNTER — Emergency Department (HOSPITAL_COMMUNITY): Payer: Medicaid Other

## 2019-09-02 ENCOUNTER — Other Ambulatory Visit: Payer: Self-pay

## 2019-09-02 ENCOUNTER — Encounter (HOSPITAL_COMMUNITY): Payer: Self-pay | Admitting: *Deleted

## 2019-09-02 DIAGNOSIS — M25562 Pain in left knee: Secondary | ICD-10-CM | POA: Insufficient documentation

## 2019-09-02 DIAGNOSIS — F17210 Nicotine dependence, cigarettes, uncomplicated: Secondary | ICD-10-CM | POA: Insufficient documentation

## 2019-09-02 DIAGNOSIS — Z79899 Other long term (current) drug therapy: Secondary | ICD-10-CM | POA: Insufficient documentation

## 2019-09-02 LAB — POC URINE PREG, ED: Preg Test, Ur: NEGATIVE

## 2019-09-02 MED ORDER — HYDROCODONE-ACETAMINOPHEN 5-325 MG PO TABS
1.0000 | ORAL_TABLET | Freq: Once | ORAL | Status: AC
  Administered 2019-09-02: 1 via ORAL
  Filled 2019-09-02: qty 1

## 2019-09-02 MED ORDER — HYDROCODONE-ACETAMINOPHEN 5-325 MG PO TABS: 1.0000 | ORAL_TABLET | ORAL | 0 refills | Status: DC | PRN

## 2019-09-02 MED ORDER — NAPROXEN 500 MG PO TABS: 500.0000 mg | ORAL_TABLET | Freq: Two times a day (BID) | ORAL | 0 refills | Status: DC

## 2019-09-02 NOTE — Discharge Instructions (Addendum)
Your xrays and your Ultrasound today are negative for any abnormalities including any recurrence of a blood clot.  Continue taking your eliquis. You may take the medicine prescribed for pain - do not drive within 4 hours of taking as it will make you drowsy.    You were also initially prescribed naproxen which has been cancelled as it can interfere with your Eliquis.  Do not get this medicine filled when you go to the pharmacy.

## 2019-09-02 NOTE — ED Triage Notes (Signed)
Pt reports R knee and upper leg pain onset x2-3 days with hx of Hip fracture, pt takes Eliquis, pt hx of blood clot, pt walking on walker, pt A&O x4

## 2019-09-02 NOTE — ED Provider Notes (Signed)
[facility] EMERGENCY DEPARTMENT Provider Note   CSN: [id number] Arrival date & time: 09/02/19  1150     History   Chief Complaint No chief complaint on file.   HPI Marie Barnes is a 43 y.o. female with a history of left hip fracture s/p surgical repair 9/3, who developed a dvt in her left lower leg after surgery, currently on Eliquis, presenting with increased pain and swelling at her left distal thigh and across her upper left knee.  She endorses constant pain, but worsened with weight bearing (currently on walker as she continues to heal from her surgery).  Denies peripheral edema.  Her pain radiates to her left outer hip area, denies groin pain.  Also denies sob, chest, fevers, chills.  Reports compliance with her Eliquis but concerned about return of a clot.  She has had no treatment prior to arrival.  She ran out of her post op pain medicine 4 days ago.     The history is provided by the patient.    Past Medical History:  Diagnosis Date   Closed posterior dislocation of left hip ([place]) 07/03/2019   Marijuana abuse 07/22/2019   Nicotine dependence 07/03/2019   Vitamin D deficiency 07/22/2019    Patient Active Problem List   Diagnosis Date Noted   Cocaine use 07/22/2019   Vitamin D deficiency 07/22/2019   Marijuana abuse 07/22/2019   Alcohol intoxication ([place]) 07/22/2019   Closed posterior dislocation of left hip ([place]) 07/03/2019   Nicotine dependence 07/03/2019   Fracture of femoral head ([place]) 07/02/2019    Past Surgical History:  Procedure Laterality Date   TUBAL LIGATION       OB History   No obstetric history on file.      Home Medications    Prior to Admission medications   Medication Sig Start Date End Date Taking? Authorizing Provider  acetaminophen (TYLENOL) 500 MG tablet Take 1 tablet (500 mg total) by mouth every 8 (eight) hours as needed. 07/03/19   Ainsley Spinner, PA-C  docusate sodium (COLACE) 100 MG capsule Take 1 capsule (100 mg total) by  mouth 2 (two) times daily. 07/03/19   Ainsley Spinner, PA-C  HYDROcodone-acetaminophen (NORCO/VICODIN) 5-325 MG tablet Take 1 tablet by mouth every 4 (four) hours as needed. 09/02/19   Evalee Jefferson, PA-C  methocarbamol (ROBAXIN) 500 MG tablet Take 1-2 tablets (500-1,000 mg total) by mouth every 8 (eight) hours as needed for muscle spasms. 07/03/19   Ainsley Spinner, PA-C  OVER THE COUNTER MEDICATION Take 1 tablet by mouth. Thyroid complex    [provider]  oxyCODONE-acetaminophen (PERCOCET) 7.5-325 MG tablet Take 1-2 tablets by mouth every 6 (six) hours as needed for moderate pain or severe pain. 07/03/19 07/02/20  Ainsley Spinner, PA-C    Family History Family History  Problem Relation Age of Onset   Diabetes Other     Social History Social History   Tobacco Use   Smoking status: Current Every Day Smoker    Packs/day: 1.00    Types: Cigarettes   Smokeless tobacco: Never Used  Substance Use Topics   Alcohol use: Yes    Comment: occasional social drinker   Drug use: Yes    Types: Marijuana, Cocaine     Allergies   Aspirin and Penicillins   Review of Systems Review of Systems  Constitutional: Negative for fever.  HENT: Negative.   Respiratory: Negative.   Cardiovascular: Negative.   Gastrointestinal: Negative.   Musculoskeletal: Positive for arthralgias. Negative for joint swelling and  myalgias.  Skin: Negative.   Neurological: Negative for weakness and numbness.     Physical Exam Updated Vital Signs BP 115/65 (BP Location: Left Arm)    Pulse 79    Temp 98 F (36.7 C) (Oral)    Resp 17    SpO2 96%   Physical Exam Constitutional:      Appearance: She is well-developed.  HENT:     Head: Atraumatic.  Neck:     Musculoskeletal: Normal range of motion.  Cardiovascular:     Pulses:          Dorsalis pedis pulses are 2+ on the right side and 2+ on the left side.     Comments: Pulses equal bilaterally Musculoskeletal:        General: Tenderness present. No deformity or  signs of injury.     Right lower leg: No edema.     Left lower leg: No edema.     Comments: ttp left anterior knee and lower left thigh.  Prominent varicosities anterior left prepatellar area, tender without induration or erythema.  No pedal edema.  Skin:    General: Skin is warm and dry.  Neurological:     Mental Status: She is alert.     Sensory: No sensory deficit.     Deep Tendon Reflexes: Reflexes normal.      ED Treatments / Results  Labs (all labs ordered are listed, but only abnormal results are displayed) Labs Reviewed  POC URINE PREG, ED    EKG None  Radiology US Venous Img Lower Unilateral Left  Result Date: 09/02/2019 CLINICAL DATA:  43 year old with left anterior thigh pain and swelling. Recent left hip arthroplasty. EXAM: LEFT LOWER EXTREMITY VENOUS DOPPLER ULTRASOUND TECHNIQUE: Gray-scale sonography with graded compression, as well as color Doppler and duplex ultrasound were performed to evaluate the lower extremity deep venous systems from the level of the common femoral vein and including the common femoral, femoral, profunda femoral, popliteal and calf veins including the posterior tibial, peroneal and gastrocnemius veins when visible. The superficial great saphenous vein was also interrogated. Spectral Doppler was utilized to evaluate flow at rest and with distal augmentation maneuvers in the common femoral, femoral and popliteal veins. COMPARISON:  07/09/2019 FINDINGS: Contralateral Common Femoral Vein: Respiratory phasicity is normal and symmetric with the symptomatic side. No evidence of thrombus. Normal compressibility. Common Femoral Vein: No evidence of thrombus. Normal compressibility, respiratory phasicity and response to augmentation. Saphenofemoral Junction: No evidence of thrombus. Normal compressibility and flow on color Doppler imaging. Profunda Femoral Vein: No evidence of thrombus. Normal compressibility and flow on color Doppler imaging. Femoral Vein: No  evidence of thrombus. Normal compressibility, respiratory phasicity and response to augmentation. Popliteal Vein: No evidence of thrombus. Normal compressibility, respiratory phasicity and response to augmentation. Calf Veins: Visualized left deep calf veins are patent without thrombus. Superficial Great Saphenous Vein: No evidence of thrombus. Normal compressibility. Other Findings:  None. IMPRESSION: Negative for deep venous thrombosis in left lower extremity. Electronically Signed   By: Markus Daft M.D.   On: 09/02/2019 14:07   Dg Knee Complete 4 Views Left  Result Date: 09/02/2019 CLINICAL DATA:  43 year old female with a history of swelling and left thigh pain EXAM: LEFT KNEE - COMPLETE 4+ VIEW COMPARISON:  None. FINDINGS: No acute displaced fracture. No radiopaque foreign body. No joint effusion. No significant degenerative changes. Tortuous venous varicosities of the left thigh. IMPRESSION: Negative for acute bony abnormality. Tortuous venous varicosities of the left thigh, potentially related to  chronic venous insufficiency. Electronically Signed   By: Corrie Mckusick D.O.   On: 09/02/2019 14:19   Dg Hip Unilat W Or Wo Pelvis 2-3 Views Left  Result Date: 09/02/2019 CLINICAL DATA:  43 year old female with swelling in left thigh pain EXAM: DG HIP (WITH OR WITHOUT PELVIS) 2-3V LEFT COMPARISON:  None. FINDINGS: Bony pelvic ring appears intact. No acute displaced fracture. Bilateral hips projects normally over the acetabula. Unremarkable proximal femurs. Heterotopic ossification/chronic changes within the soft tissues superior to the left femoral neck. Degenerative changes bilateral hips. IMPRESSION: Negative for acute bony abnormality. Degenerative changes of the bilateral hips, with heterotopic ossification in the soft tissues overlying the left hip. Electronically Signed   By: Corrie Mckusick D.O.   On: 09/02/2019 14:22    Procedures Procedures (including critical care time)  Medications Ordered in  ED Medications - No data to display   Initial Impression / Assessment and Plan / ED Course  I have reviewed the triage vital signs and the nursing notes.  Pertinent labs & imaging results that were available during my care of the patient were reviewed by me and considered in my medical decision making (see chart for details).        Imaging reviewed and discussed with patient, she does not have a DVT, also no signs of any complications from her recent surgery.  She can range her knee although uncomfortable.  There is no effusion, no erythema or increased warmth to suggest septic joint.  She was prescribed a few hydrocodone tablets.  She was advised to follow-up with her orthopedic surgeon as needed for any persistent or worsening symptoms.  Final Clinical Impressions(s) / ED Diagnoses   Final diagnoses:  Acute pain of left knee    ED Discharge Orders         Ordered    naproxen (NAPROSYN) 500 MG tablet  2 times daily,   Status:  Discontinued     09/02/19 1433    HYDROcodone-acetaminophen (NORCO/VICODIN) 5-325 MG tablet  Every 4 hours PRN     09/02/19 1443           Evalee Jefferson, PA-C 09/02/19 1445    Ezequiel Essex, MD 09/02/19 1731

## 2019-10-19 ENCOUNTER — Other Ambulatory Visit: Payer: Self-pay | Admitting: Orthopedic Surgery

## 2019-10-19 DIAGNOSIS — M1612 Unilateral primary osteoarthritis, left hip: Secondary | ICD-10-CM

## 2019-11-04 ENCOUNTER — Inpatient Hospital Stay: Admission: RE | Admit: 2019-11-04 | Payer: Medicaid Other | Source: Ambulatory Visit

## 2019-11-10 ENCOUNTER — Emergency Department (HOSPITAL_COMMUNITY)
Admission: EM | Admit: 2019-11-10 | Discharge: 2019-11-10 | Disposition: A | Discharge status: Home / Self Care | Payer: Self-pay | Attending: Emergency Medicine | Admitting: Emergency Medicine

## 2019-11-10 ENCOUNTER — Other Ambulatory Visit: Payer: Self-pay

## 2019-11-10 ENCOUNTER — Emergency Department (HOSPITAL_COMMUNITY): Payer: Self-pay

## 2019-11-10 ENCOUNTER — Encounter (HOSPITAL_COMMUNITY): Payer: Self-pay

## 2019-11-10 DIAGNOSIS — F17210 Nicotine dependence, cigarettes, uncomplicated: Secondary | ICD-10-CM | POA: Insufficient documentation

## 2019-11-10 DIAGNOSIS — M25559 Pain in unspecified hip: Secondary | ICD-10-CM

## 2019-11-10 DIAGNOSIS — Z79899 Other long term (current) drug therapy: Secondary | ICD-10-CM | POA: Insufficient documentation

## 2019-11-10 DIAGNOSIS — Z20822 Contact with and (suspected) exposure to covid-19: Secondary | ICD-10-CM | POA: Insufficient documentation

## 2019-11-10 DIAGNOSIS — M25552 Pain in left hip: Secondary | ICD-10-CM | POA: Insufficient documentation

## 2019-11-10 LAB — CBC WITH DIFFERENTIAL/PLATELET
Abs Immature Granulocytes: 0.03 10*3/uL (ref 0.00–0.07)
Basophils Absolute: 0.1 10*3/uL (ref 0.0–0.1)
Basophils Relative: 1 %
Eosinophils Absolute: 0.3 10*3/uL (ref 0.0–0.5)
Eosinophils Relative: 4 %
HCT: 41.4 % (ref 36.0–46.0)
Hemoglobin: 13.2 g/dL (ref 12.0–15.0)
Immature Granulocytes: 0 %
Lymphocytes Relative: 31 %
Lymphs Abs: 2.7 10*3/uL (ref 0.7–4.0)
MCH: 30.9 pg (ref 26.0–34.0)
MCHC: 31.9 g/dL (ref 30.0–36.0)
MCV: 97 fL (ref 80.0–100.0)
Monocytes Absolute: 0.7 10*3/uL (ref 0.1–1.0)
Monocytes Relative: 8 %
Neutro Abs: 5 10*3/uL (ref 1.7–7.7)
Neutrophils Relative %: 56 %
Platelets: 275 10*3/uL (ref 150–400)
RBC: 4.27 MIL/uL (ref 3.87–5.11)
RDW: 13.7 % (ref 11.5–15.5)
WBC: 8.8 10*3/uL (ref 4.0–10.5)
nRBC: 0 % (ref 0.0–0.2)

## 2019-11-10 LAB — COMPREHENSIVE METABOLIC PANEL
ALT: 16 U/L (ref 0–44)
AST: 15 U/L (ref 15–41)
Albumin: 4.3 g/dL (ref 3.5–5.0)
Alkaline Phosphatase: 61 U/L (ref 38–126)
Anion gap: 9 (ref 5–15)
BUN: 6 mg/dL (ref 6–20)
CO2: 26 mmol/L (ref 22–32)
Calcium: 9.1 mg/dL (ref 8.9–10.3)
Chloride: 102 mmol/L (ref 98–111)
Creatinine, Ser: 0.8 mg/dL (ref 0.44–1.00)
GFR calc Af Amer: >60 mL/min (ref >60)
GFR calc non Af Amer: >60 mL/min (ref >60)
Glucose, Bld: 97 mg/dL (ref 70–99)
Potassium: 3.5 mmol/L (ref 3.5–5.1)
Sodium: 137 mmol/L (ref 135–145)
Total Bilirubin: 0.4 mg/dL (ref 0.3–1.2)
Total Protein: 7.4 g/dL (ref 6.5–8.1)

## 2019-11-10 LAB — URINALYSIS, ROUTINE W REFLEX MICROSCOPIC
Bilirubin Urine: NEGATIVE
Glucose, UA: NEGATIVE mg/dL
Hgb urine dipstick: NEGATIVE
Ketones, ur: NEGATIVE mg/dL
Leukocytes,Ua: NEGATIVE
Nitrite: NEGATIVE
Protein, ur: NEGATIVE mg/dL
Specific Gravity, Urine: 1.006 (ref 1.005–1.030)
pH: 6 (ref 5.0–8.0)

## 2019-11-10 LAB — PREGNANCY, URINE: Preg Test, Ur: NEGATIVE

## 2019-11-10 MED ORDER — MORPHINE SULFATE (PF) 4 MG/ML IV SOLN
4.0000 mg | Freq: Once | INTRAVENOUS | Status: AC
  Administered 2019-11-10: 4 mg via INTRAVENOUS
  Filled 2019-11-10: qty 1

## 2019-11-10 MED ORDER — ONDANSETRON HCL 4 MG/2ML IJ SOLN
4.0000 mg | Freq: Once | INTRAMUSCULAR | Status: AC
  Administered 2019-11-10: 4 mg via INTRAVENOUS
  Filled 2019-11-10: qty 2

## 2019-11-10 MED ORDER — METHOCARBAMOL 500 MG PO TABS: 500.0000 mg | ORAL_TABLET | Freq: Three times a day (TID) | ORAL | 0 refills | Status: DC | PRN

## 2019-11-10 MED ORDER — IOHEXOL 300 MG/ML  SOLN
100.0000 mL | Freq: Once | INTRAMUSCULAR | Status: AC | PRN
  Administered 2019-11-10: 100 mL via INTRAVENOUS

## 2019-11-10 NOTE — ED Triage Notes (Signed)
Pt reports llq pain x 1 week.Reports frequent urination.  Also reports pain in left leg.  Reports hip fracture approx 1 month ago.  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  Pt says her lmp was Dec 29 but only had " a spot."

## 2019-11-10 NOTE — ED Notes (Signed)
Patient transported to CT 

## 2019-11-10 NOTE — Discharge Instructions (Signed)
Please follow-up with Dr. Marcelino Scot on this issue.  Call to make an appointment. Methocarbamol: Methocarbamol (generic for Robaxin) is a muscle relaxer and can help relieve stiff muscles or muscle spasms.  Do not drive or perform other dangerous activities while taking this medication as it can cause drowsiness as well as changes in reaction time and judgement. Return: Return to the emergency department for fever, numbness, weakness, significantly worsening pain, changes in bowel or bladder function, or any other complaints.

## 2019-11-10 NOTE — ED Provider Notes (Signed)
[facility] EMERGENCY DEPARTMENT Provider Note   CSN: [id number] Arrival date & time: 11/10/19  1210     History Chief Complaint  Patient presents with  . Abdominal Pain    Marie Barnes is a 44 y.o. female.  HPI      Marie Barnes is a 44 y.o. female, with a history of left hip fracture, presenting to the ED with left lower abdominal pain beginning about a month ago, but worse over the last several days. She states she does not know if her pain is related to her return to work following her left hip surgery in September.  She spends a lot of time standing and walking in her job. She has also had some urinary frequency over the last week.  Loose stools over the last 2 days.  No recent antibiotics. Denies fever/chills, numbness, weakness, recent falls/trauma, hematochezia/melena, nausea/vomiting, dysuria, hematuria, difficulty urinating, saddle anesthesias, or any other complaints.    Past Medical History:  Diagnosis Date  . Closed posterior dislocation of left hip ([place]) 07/03/2019  . Marijuana abuse 07/22/2019  . Nicotine dependence 07/03/2019  . Vitamin D deficiency 07/22/2019    Patient Active Problem List   Diagnosis Date Noted  . Cocaine use 07/22/2019  . Vitamin D deficiency 07/22/2019  . Marijuana abuse 07/22/2019  . Alcohol intoxication ([place]) 07/22/2019  . Closed posterior dislocation of left hip ([place]) 07/03/2019  . Nicotine dependence 07/03/2019  . Fracture of femoral head ([place]) 07/02/2019    Past Surgical History:  Procedure Laterality Date  . TUBAL LIGATION       OB History   No obstetric history on file.     Family History  Problem Relation Age of Onset  . Diabetes Other     Social History   Tobacco Use  . Smoking status: Current Every Day Smoker    Packs/day: 1.00    Types: Cigarettes  . Smokeless tobacco: Never Used  Substance Use Topics  . Alcohol use: Yes    Comment: occasional social drinker  . Drug use: Yes    Types:  Marijuana, Cocaine    Comment: denies    Home Medications Prior to Admission medications   Medication Sig Start Date End Date Taking? Authorizing Provider  acetaminophen (TYLENOL) 500 MG tablet Take 1 tablet (500 mg total) by mouth every 8 (eight) hours as needed. 07/03/19   Ainsley Spinner, PA-C  docusate sodium (COLACE) 100 MG capsule Take 1 capsule (100 mg total) by mouth 2 (two) times daily. 07/03/19   Ainsley Spinner, PA-C  HYDROcodone-acetaminophen (NORCO/VICODIN) 5-325 MG tablet Take 1 tablet by mouth every 4 (four) hours as needed. 09/02/19   Evalee Jefferson, PA-C  methocarbamol (ROBAXIN) 500 MG tablet Take 1-2 tablets (500-1,000 mg total) by mouth every 8 (eight) hours as needed for muscle spasms. 11/10/19   Kharter Sestak C, PA-C  OVER THE COUNTER MEDICATION Take 1 tablet by mouth. Thyroid complex    [provider]  oxyCODONE-acetaminophen (PERCOCET) 7.5-325 MG tablet Take 1-2 tablets by mouth every 6 (six) hours as needed for moderate pain or severe pain. 07/03/19 07/02/20  Ainsley Spinner, PA-C    Allergies    Aspirin and Penicillins  Review of Systems   Review of Systems  Constitutional: Negative for chills, diaphoresis and fever.  Respiratory: Negative for cough and shortness of breath.   Cardiovascular: Negative for chest pain.  Gastrointestinal: Positive for abdominal pain and diarrhea. Negative for blood in stool, nausea and vomiting.  Genitourinary: Positive for frequency.  Negative for dysuria, flank pain, hematuria, vaginal bleeding and vaginal discharge.  Musculoskeletal: Positive for arthralgias.  Neurological: Negative for syncope, weakness and numbness.  All other systems reviewed and are negative.   Physical Exam Updated Vital Signs BP 132/69 (BP Location: Right Arm)   Pulse 84   Temp 97.9 F (36.6 C) (Oral)   Resp 18   Ht 5\' 6"  (1.676 m)   Wt 113.4 kg   LMP 10/27/2019   SpO2 99%   BMI 40.35 kg/m   Physical Exam Vitals and nursing note reviewed.  Constitutional:       General: She is not in acute distress.    Appearance: She is well-developed. She is not diaphoretic.  HENT:     Head: Normocephalic and atraumatic.     Mouth/Throat:     Mouth: Mucous membranes are moist.     Pharynx: Oropharynx is clear.  Eyes:     Conjunctiva/sclera: Conjunctivae normal.  Cardiovascular:     Rate and Rhythm: Normal rate and regular rhythm.     Pulses: Normal pulses.          Radial pulses are 2+ on the right side and 2+ on the left side.       Posterior tibial pulses are 2+ on the right side and 2+ on the left side.     Heart sounds: Normal heart sounds.     Comments: Tactile temperature in the extremities appropriate and equal bilaterally. Pulmonary:     Effort: Pulmonary effort is normal. No respiratory distress.     Breath sounds: Normal breath sounds.  Abdominal:     Palpations: Abdomen is soft.     Tenderness: There is abdominal tenderness. There is no guarding.    Musculoskeletal:     Cervical back: Neck supple.     Right lower leg: No edema.     Left lower leg: No edema.     Comments: Patient has some mild pain with movement of the left hip.  No noted swelling, deformity, or instability.  No increased warmth or color change. No tenderness, increased warmth, erythema, or pain with range of motion of the rest of the lower extremity.  Lymphadenopathy:     Cervical: No cervical adenopathy.  Skin:    General: Skin is warm and dry.  Neurological:     Mental Status: She is alert.     Comments: Sensation grossly intact to light touch in the lower extremities bilaterally. No saddle anesthesias. Strength 5/5 in the bilateral lower extremities. Ambulatory without assistance. Coordination intact.  Psychiatric:        Mood and Affect: Mood and affect normal.        Speech: Speech normal.        Behavior: Behavior normal.     ED Results / Procedures / Treatments   Labs (all labs ordered are listed, but only abnormal results are displayed) Labs Reviewed    NOVEL CORONAVIRUS, NAA (HOSP ORDER, SEND-OUT TO REF LAB; TAT 18-24 HRS)  URINE CULTURE  CBC WITH DIFFERENTIAL/PLATELET  COMPREHENSIVE METABOLIC PANEL  URINALYSIS, ROUTINE W REFLEX MICROSCOPIC  PREGNANCY, URINE    EKG None  Radiology CT ABDOMEN PELVIS W CONTRAST  Addendum Date: 11/10/2019   ADDENDUM REPORT: 11/10/2019 17:00 ADDENDUM: Impression #4 should read: Left hip fractures with persistent periarticular fracture fragments versus areas of heterotopic ossification. Electronically Signed   By: Marijo Sanes M.D.   On: 11/10/2019 17:00   Result Date: 11/10/2019 CLINICAL DATA:  Left lower quadrant abdominal pain  for 1 week. EXAM: CT ABDOMEN AND PELVIS WITH CONTRAST TECHNIQUE: Multidetector CT imaging of the abdomen and pelvis was performed using the standard protocol following bolus administration of intravenous contrast. CONTRAST:  167mL OMNIPAQUE IOHEXOL 300 MG/ML  SOLN COMPARISON:  07/02/2019 FINDINGS: Lower chest: No acute pulmonary findings. Patchy atelectasis. The heart is normal in size. No pericardial effusion. The distal esophagus is unremarkable. Hepatobiliary: No focal hepatic lesions or intrahepatic biliary dilatation. Gallbladder is contracted. No common bile duct dilatation. Pancreas: No mass, inflammation or ductal dilatation. Spleen: Normal size.  No focal lesions. Adrenals/Urinary Tract: The adrenal glands and kidneys are unremarkable. Few small renal cysts are noted. No worrisome renal lesions. No bladder lesions or asymmetric bladder wall thickening. Stomach/Bowel: The stomach, duodenum, small bowel and colon are grossly normal without oral contrast. No acute inflammatory changes, mass lesions or obstructive findings. The terminal ileum is normal. The appendix is normal. Scattered sigmoid colon diverticulosis but no findings for acute diverticulitis. Vascular/Lymphatic: The aorta is normal in caliber. No dissection. The branch vessels are patent. The major venous structures are  patent. No mesenteric or retroperitoneal mass or adenopathy. Small scattered lymph nodes are noted. Reproductive: Enlarged fibroid uterus is again demonstrated. Both ovaries appear normal. Other: No pelvic mass or free pelvic fluid collections. No pelvic or inguinal adenopathy. Musculoskeletal: Remote left femoral head fractures with displaced fragments around the joint. No acute bony findings. IMPRESSION: 1. No acute abdominal/pelvic findings, mass lesions or adenopathy. Specifically, no CT findings for acute diverticulitis. 2. Contracted gallbladder. 3. Enlarged fibroid uterus. 4. Right hip fractures with persistent fracture fragments versus is areas of heterotopic ossification. Electronically Signed: By: Marijo Sanes M.D. On: 11/10/2019 16:13    Procedures Procedures (including critical care time)  Medications Ordered in ED Medications  iohexol (OMNIPAQUE) 300 MG/ML solution 100 mL (100 mLs Intravenous Contrast Given 11/10/19 1542)  morphine 4 MG/ML injection 4 mg (4 mg Intravenous Given 11/10/19 1711)  ondansetron (ZOFRAN) injection 4 mg (4 mg Intravenous Given 11/10/19 1703)    ED Course  I have reviewed the triage vital signs and the nursing notes.  Pertinent labs & imaging results that were available during my care of the patient were reviewed by me and considered in my medical decision making (see chart for details).  Clinical Course as of Nov 09 1840  Tue Nov 10, 2019  1712 Spoke with Dr. Ronnie Derby, on call orthopedic surgeon covering for Dr. Carlean Jews practice.  States a change in a position of bone fragment in that hip could be causing patient pain. She should follow up with [facility] in the office.   [SJ]    Clinical Course User Index [SJ] Ernesto Lashway, Helane Gunther, PA-C   MDM Rules/Calculators/A&P                      Patient presents with what she calls left lower abdominal pain.  Her pain may actually be coming from the left hip region. Patient is nontoxic appearing, afebrile, not tachycardic,  not tachypneic, not hypotensive, maintains excellent SPO2 on room air, and is in no apparent distress.  Low suspicion for septic arthritis.  No leukocytosis.  Exam is not suggestive. There is a bone fragment noted on the CT.  There were bone fragments noted on the patient's latest x-ray of the hip, however, bone fragment on the CT appears as though it may be in a different position.  This may be the source of the patient's pain.  CT otherwise without acute  abnormality.  As for the patient's urinary frequency, her UA is without any abnormality.  Urine culture in process.  Patient will follow up with orthopedics as well as her PCP. The patient was given instructions for home care as well as return precautions. Patient voices understanding of these instructions, accepts the plan, and is comfortable with discharge.  Vitals:   11/10/19 1218 11/10/19 1721  BP: 132/69 117/66  Pulse: 84 67  Resp: 18 16  Temp: 97.9 F (36.6 C) 97.8 F (36.6 C)  TempSrc: Oral Oral  SpO2: 99% 100%  Weight: 113.4 kg   Height: 5\' 6"  (1.676 m)      Final Clinical Impression(s) / ED Diagnoses Final diagnoses:  Hip pain    Rx / DC Orders ED Discharge Orders         Ordered    methocarbamol (ROBAXIN) 500 MG tablet  Every 8 hours PRN     11/10/19 1731           Lorayne Bender, PA-C 11/10/19 1844    Margette Fast, MD 11/11/19 1202

## 2019-11-11 LAB — NOVEL CORONAVIRUS, NAA (HOSP ORDER, SEND-OUT TO REF LAB; TAT 18-24 HRS): SARS-CoV-2, NAA: NOT DETECTED

## 2019-11-11 LAB — URINE CULTURE: Culture: <10000 — AB

## 2020-01-11 ENCOUNTER — Other Ambulatory Visit: Payer: Self-pay

## 2020-01-11 ENCOUNTER — Emergency Department (HOSPITAL_COMMUNITY): Payer: Medicaid Other

## 2020-01-11 ENCOUNTER — Emergency Department (HOSPITAL_COMMUNITY)
Admission: EM | Admit: 2020-01-11 | Discharge: 2020-01-11 | Disposition: A | Discharge status: Home / Self Care | Payer: Medicaid Other | Attending: Emergency Medicine | Admitting: Emergency Medicine

## 2020-01-11 ENCOUNTER — Encounter (HOSPITAL_COMMUNITY): Payer: Self-pay | Admitting: Emergency Medicine

## 2020-01-11 DIAGNOSIS — R102 Pelvic and perineal pain: Secondary | ICD-10-CM

## 2020-01-11 DIAGNOSIS — D219 Benign neoplasm of connective and other soft tissue, unspecified: Secondary | ICD-10-CM

## 2020-01-11 DIAGNOSIS — J020 Streptococcal pharyngitis: Secondary | ICD-10-CM | POA: Insufficient documentation

## 2020-01-11 DIAGNOSIS — B9689 Other specified bacterial agents as the cause of diseases classified elsewhere: Secondary | ICD-10-CM

## 2020-01-11 DIAGNOSIS — F17210 Nicotine dependence, cigarettes, uncomplicated: Secondary | ICD-10-CM | POA: Insufficient documentation

## 2020-01-11 DIAGNOSIS — D259 Leiomyoma of uterus, unspecified: Secondary | ICD-10-CM | POA: Insufficient documentation

## 2020-01-11 DIAGNOSIS — M25552 Pain in left hip: Secondary | ICD-10-CM | POA: Insufficient documentation

## 2020-01-11 DIAGNOSIS — N760 Acute vaginitis: Secondary | ICD-10-CM | POA: Insufficient documentation

## 2020-01-11 LAB — URINALYSIS, ROUTINE W REFLEX MICROSCOPIC
Bilirubin Urine: NEGATIVE
Glucose, UA: NEGATIVE mg/dL
Hgb urine dipstick: NEGATIVE
Ketones, ur: NEGATIVE mg/dL
Leukocytes,Ua: NEGATIVE
Nitrite: NEGATIVE
Protein, ur: NEGATIVE mg/dL
Specific Gravity, Urine: 1.001 — ABNORMAL LOW (ref 1.005–1.030)
pH: 7 (ref 5.0–8.0)

## 2020-01-11 LAB — WET PREP, GENITAL
Sperm: NONE SEEN
Trich, Wet Prep: NONE SEEN
Yeast Wet Prep HPF POC: NONE SEEN

## 2020-01-11 LAB — HIV ANTIBODY (ROUTINE TESTING W REFLEX): HIV Screen 4th Generation wRfx: NONREACTIVE

## 2020-01-11 LAB — PREGNANCY, URINE: Preg Test, Ur: NEGATIVE

## 2020-01-11 LAB — GROUP A STREP BY PCR: Group A Strep by PCR: DETECTED — AB

## 2020-01-11 MED ORDER — METHOCARBAMOL 500 MG PO TABS: 500.0000 mg | ORAL_TABLET | Freq: Two times a day (BID) | ORAL | 0 refills | Status: DC | PRN

## 2020-01-11 MED ORDER — DOXYCYCLINE HYCLATE 100 MG PO CAPS: 100.0000 mg | ORAL_CAPSULE | Freq: Two times a day (BID) | ORAL | 0 refills | Status: DC

## 2020-01-11 MED ORDER — PENICILLIN G BENZATHINE 1200000 UNIT/2ML IM SUSP
1.2000 10*6.[IU] | Freq: Once | INTRAMUSCULAR | Status: AC
  Administered 2020-01-11: 1.2 10*6.[IU] via INTRAMUSCULAR
  Filled 2020-01-11: qty 2

## 2020-01-11 MED ORDER — STERILE WATER FOR INJECTION IJ SOLN
INTRAMUSCULAR | Status: AC
  Administered 2020-01-11: 10 mL
  Filled 2020-01-11: qty 10

## 2020-01-11 MED ORDER — METRONIDAZOLE 500 MG PO TABS: 500.0000 mg | ORAL_TABLET | Freq: Two times a day (BID) | ORAL | 0 refills | Status: DC

## 2020-01-11 MED ORDER — CEFTRIAXONE SODIUM 500 MG IJ SOLR
500.0000 mg | Freq: Once | INTRAMUSCULAR | Status: AC
  Administered 2020-01-11: 500 mg via INTRAMUSCULAR
  Filled 2020-01-11: qty 500

## 2020-01-11 MED ORDER — NAPROXEN 500 MG PO TABS: 500.0000 mg | ORAL_TABLET | Freq: Two times a day (BID) | ORAL | 0 refills | Status: DC

## 2020-01-11 NOTE — ED Triage Notes (Signed)
PT c/o sore throat mainly on the right side x1 day. PT also c/o increased urinary frequency and buring with urination x1 week. PT also c/o continued left hip pain unrelieved by Meloxicam 7.5 mg twice a day prescribed by her PCP on 12/30/19.

## 2020-01-11 NOTE — Discharge Instructions (Signed)
You were seen in the emergency department today for a sore throat, urinary symptoms, vaginal discharge, pelvic pain, and hip pain.  Your strep test was positive.  We treated you with antibiotics in the emergency department for strep throat.  Your ultrasound showed that you do have a uterine fibroid which may be contributing to your pelvic discomfort.  Your urine did not show a UTI.  Your wet prep showed finding of bacterial vaginosis which is not a sexually transmitted infection, please see attached handout.  Given your discomfort and recent intercourse we are treating you for pelvic inflammatory disease with antibiotics.  Please take Flagyl and doxycycline as prescribed, be sure to take these with food as they can cause GI upset, do not drink alcohol when taking Flagyl as it can be extremely dangerous.  Your left hip x-ray did not show any fractures or dislocations, it did show some narrowing of the joint we are sending you home with the following medicines to help with your symptoms.  Do not take meloxicam when taking naproxen.  - Naproxen is a nonsteroidal anti-inflammatory medication that will help with pain and swelling. Be sure to take this medication as prescribed with food, 1 pill every 12 hours,  It should be taken with food, as it can cause stomach upset, and more seriously, stomach bleeding. Do not take other nonsteroidal anti-inflammatory medications with this such as Advil, Motrin, Aleve, Mobic, meloxicam, Goodie Powder, or Motrin.    - Robaxin is the muscle relaxer I have prescribed, this is meant to help with muscle tightness. Be aware that this medication may make you drowsy therefore the first time you take this it should be at a time you are in an environment where you can rest. Do not drive or operate heavy machinery when taking this medication. Do not drink alcohol or take other sedating medications with this medicine such as narcotics or benzodiazepines.   You make take Tylenol per  over the counter dosing with these medications.   We have prescribed you new medication(s) today. Discuss the medications prescribed today with your pharmacist as they can have adverse effects and interactions with your other medicines including over the counter and prescribed medications. Seek medical evaluation if you start to experience new or abnormal symptoms after taking one of these medicines, seek care immediately if you start to experience difficulty breathing, feeling of your throat closing, facial swelling, or rash as these could be indications of a more serious allergic reaction  We have tested you for sexually transmitted infections including gonorrhea, chlamydia, HIV, and syphilis, we will call you if any of these results are positive, if positive you will need to inform all sexual partners.  Please follow-up with your orthopedic doctor within 1 week for reevaluation of your hip pain.  Please follow-up with OB/GYN for reevaluation of your pelvic discomfort.  You may follow-up with your PCP for general follow-up as well.  Return to the emergency department for new or worsening symptoms including but not limited to increased pain, fever, inability to keep fluids down, numbness, weakness, passing out, or any other concerns.   [facility] Primary Care Doctor List    Sinda Du MD. Specialty: Pulmonary Disease Contact information: [address]  [place] [address]  [phone]   Tula Nakayama, MD. Specialty: [facility] Contact information: [address]  [place] [address]  [phone]   Sallee Lange, MD. Specialty: Family Medicine Contact information: [place]  [place] [address]  [phone]   Rosita Fire, MD Specialty: Internal Medicine Contact information: [place] [address]  [phone]   Delphina Cahill, MD. Specialty: Internal Medicine Contact information: [place] [address]  [phone]    [facility] (Dr. Maudie Mercury) Specialty: Family Medicine Contact information: [place] [address]  [phone]   Leslie Andrea, MD. Specialty: [facility] Contact information: [address]  [address]  [place] [address]  [phone]   Asencion Noble, MD. Specialty: Internal Medicine Contact information: [address]  [place] [address]  [place]  [address] [place], [place] [address] [phone]  Services [facility] offers a variety of basic health services.  Services include but are not limited to: Blood pressure checks  Heart rate checks  Blood sugar checks  Urine analysis  Rapid strep tests  Pregnancy tests.  Health education and referrals  People needing more complex services will be directed to a physician online. Using these virtual visits, doctors can evaluate and prescribe medicine and treatments. There will be no medication on-site, though [facility] will help patients fill their prescriptions at little to no cost.   For More information please go to: [email or web address]

## 2020-01-11 NOTE — ED Provider Notes (Signed)
[facility] EMERGENCY DEPARTMENT Provider Note   CSN: [id number] Arrival date & time: 01/11/20  [id number]     History Chief Complaint  Patient presents with  . Sore Throat  . Urinary Frequency    Marie Barnes is a 44 y.o. female with a history of tobacco abuse, prior L hip dislocation, prior femoral head fracture, marijuana & cocaine use who presents to the ED with multiple complaints.   Patient reports sore throat that started yesterday, constant, worse with swallowing, no alleviating factors.  Denies fever, chills, voice change, cough, dyspnea, or vomiting.  Patient reports urinary symptoms x 1 week.  She reports dysuria, urgency, and frequency.  With her urinary symptoms she has noted some vaginal discharge that is malodorous and nonpruritic as well as some left-sided suprapubic discomfort.  Sexually active with one partner without protection.  Symptoms worse with urination, no other alleviating or aggravating factors.  Denies fever, chills, vaginal bleeding, diarrhea, constipation, melena, or hematochezia.  Patient reports left hip pain which has been occurring since her fracture in September 2020.  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  She denies traumatic fall.  Denies numbness, tingling, or weakness.  Denies incontinence or saddle anesthesia.  HPI     Past Medical History:  Diagnosis Date  . Closed posterior dislocation of left hip ([place]) 07/03/2019  . Marijuana abuse 07/22/2019  . Nicotine dependence 07/03/2019  . Vitamin D deficiency 07/22/2019    Patient Active Problem List   Diagnosis Date Noted  . Cocaine use 07/22/2019  . Vitamin D deficiency 07/22/2019  . Marijuana abuse 07/22/2019  . Alcohol intoxication ([place]) 07/22/2019  . Closed posterior dislocation of left hip ([place]) 07/03/2019  . Nicotine dependence 07/03/2019  . Fracture of femoral head ([place]) 07/02/2019     Past Surgical History:  Procedure Laterality Date  . TUBAL LIGATION       OB History   No obstetric history on file.     Family History  Problem Relation Age of Onset  . Diabetes Other     Social History   Tobacco Use  . Smoking status: Current Every Day Smoker    Packs/day: 1.00    Types: Cigarettes  . Smokeless tobacco: Never Used  Substance Use Topics  . Alcohol use: Yes    Comment: occasional social drinker  . Drug use: Yes    Types: Marijuana, Cocaine    Comment: denies    Home Medications Prior to Admission medications   Medication Sig Start Date End Date Taking? Authorizing Provider  acetaminophen (TYLENOL) 500 MG tablet Take 1 tablet (500 mg total) by mouth every 8 (eight) hours as needed. 07/03/19   Ainsley Spinner, PA-C  docusate sodium (COLACE) 100 MG capsule Take 1 capsule (100 mg total) by mouth 2 (two) times daily. 07/03/19   Ainsley Spinner, PA-C  HYDROcodone-acetaminophen (NORCO/VICODIN) 5-325 MG tablet Take 1 tablet by mouth every 4 (four) hours as needed. 09/02/19   Evalee Jefferson, PA-C  methocarbamol (ROBAXIN) 500 MG tablet Take 1-2 tablets (500-1,000 mg total) by mouth every 8 (eight) hours as needed for muscle spasms. 11/10/19   Joy, Shawn C, PA-C  OVER THE COUNTER MEDICATION Take 1 tablet by mouth. Thyroid complex    [provider]  oxyCODONE-acetaminophen (PERCOCET) 7.5-325 MG tablet Take 1-2 tablets by mouth every 6 (six) hours as needed for moderate pain or severe pain. 07/03/19 07/02/20  Ainsley Spinner, PA-C    Allergies    Aspirin and Penicillins  Review of Systems   Review of Systems  Constitutional: Negative for chills and fever.  HENT: Positive for sore throat. Negative for congestion, ear pain and voice change.   Respiratory: Negative for shortness of breath.   Cardiovascular: Negative for chest pain.  Gastrointestinal: Positive for abdominal pain. Negative for nausea and vomiting.  Genitourinary: Positive for dysuria, frequency, urgency and  vaginal discharge. Negative for flank pain, hematuria and vaginal bleeding.  Musculoskeletal: Positive for arthralgias.  Neurological: Negative for weakness and numbness.       Negative for incontinence or saddle anesthesia.  All other systems reviewed and are negative.   Physical Exam Updated Vital Signs BP 120/69 (BP Location: Left Arm)   Pulse 82   Temp 98 F (36.7 C) (Oral)   Resp 18   Ht 5\' 6"  (1.676 m)   Wt 97.5 kg   SpO2 95%   BMI 34.70 kg/m   Physical Exam Vitals and nursing note reviewed. Exam conducted with a chaperone present.  Constitutional:      General: She is not in acute distress.    Appearance: She is well-developed. She is not ill-appearing or toxic-appearing.  HENT:     Head: Normocephalic and atraumatic.     Right Ear: Ear canal normal. Tympanic membrane is not perforated, erythematous, retracted or bulging.     Left Ear: Ear canal normal. Tympanic membrane is not perforated, erythematous, retracted or bulging.     Ears:     Comments: No mastoid erythema/swelling/tenderness.     Nose:     Right Sinus: No maxillary sinus tenderness or frontal sinus tenderness.     Left Sinus: No maxillary sinus tenderness or frontal sinus tenderness.     Mouth/Throat:     Pharynx: Uvula midline. Posterior oropharyngeal erythema present. No oropharyngeal exudate.     Tonsils: 1+ on the right. 1+ on the left.     Comments: Posterior oropharynx is symmetric appearing. Patient tolerating own secretions without difficulty. No trismus. No drooling. No hot potato voice. No swelling beneath the tongue, submandibular compartment is soft.  Eyes:     General:        Right eye: No discharge.        Left eye: No discharge.     Conjunctiva/sclera: Conjunctivae normal.     Pupils: Pupils are equal, round, and reactive to light.  Cardiovascular:     Rate and Rhythm: Normal rate and regular rhythm.     Pulses:          Dorsalis pedis pulses are 2+ on the right side and 2+ on the left  side.       Posterior tibial pulses are 2+ on the right side and 2+ on the left side.     Heart sounds: No murmur.  Pulmonary:     Effort: Pulmonary effort is normal. No respiratory distress.     Breath sounds: Normal breath sounds. No wheezing, rhonchi or rales.  Abdominal:     General: There is no distension.     Palpations: Abdomen is soft.     Tenderness: There is abdominal tenderness in the suprapubic area. There is no guarding or rebound. Negative signs include McBurney's sign.  Genitourinary:    Labia:        Right: No lesion.        Left: No lesion.      Cervix: Discharge (mild) present. No cervical motion  tenderness or friability.     Adnexa:        Right: No mass, tenderness or fullness.         Left: Tenderness present. No mass or fullness.    Musculoskeletal:     Cervical back: Normal range of motion and neck supple. No edema or rigidity.     Comments: Lower extremities: No obvious deformity, appreciable swelling, edema, erythema, ecchymosis, warmth, or open wounds. Patient has intact AROM to bilateral hips, knees, ankles, and all digits. Tender to palpation to the diffuse L hip. Otherwise nontender. Compartments are soft.   Lymphadenopathy:     Cervical: No cervical adenopathy.  Skin:    General: Skin is warm and dry.     Capillary Refill: Capillary refill takes less than 2 seconds.     Findings: No rash.  Neurological:     Mental Status: She is alert.     Comments: Alert. Clear speech. Sensation grossly intact to bilateral lower extremities. 5/5 strength with plantar/dorsiflexion bilaterally. Patient ambulatory  Psychiatric:        Mood and Affect: Mood normal.        Behavior: Behavior normal.     ED Results / Procedures / Treatments   Labs (all labs ordered are listed, but only abnormal results are displayed) Labs Reviewed  GROUP A STREP BY PCR - Abnormal; Notable for the following components:      Result Value   Group A Strep by PCR DETECTED (*)    All  other components within normal limits  WET PREP, GENITAL - Abnormal; Notable for the following components:   Clue Cells Wet Prep HPF POC PRESENT (*)    WBC, Wet Prep HPF POC RARE (*)    All other components within normal limits  URINALYSIS, ROUTINE W REFLEX MICROSCOPIC - Abnormal; Notable for the following components:   Color, Urine STRAW (*)    Specific Gravity, Urine 1.001 (*)    All other components within normal limits  URINE CULTURE  PREGNANCY, URINE  RPR  HIV ANTIBODY (ROUTINE TESTING W REFLEX)  GC/CHLAMYDIA PROBE AMP ([facility]ael [facility]) NOT AT [facility]    EKG None  Radiology US PELVIC COMPLETE W TRANSVAGINAL AND TORSION R/O  Result Date: 01/11/2020 CLINICAL DATA:  LEFT pelvic pain. Concern for ovarian torsion. Prior tubal ligation. History of uterine fibroids. Recent LEFT hip fracture EXAM: TRANSABDOMINAL AND TRANSVAGINAL ULTRASOUND OF PELVIS TECHNIQUE: Both transabdominal and transvaginal ultrasound examinations of the pelvis were performed. Transabdominal technique was performed for global imaging of the pelvis including uterus, ovaries, adnexal regions, and pelvic cul-de-sac. It was necessary to proceed with endovaginal exam following the transabdominal exam to visualize the endometrium and ovaries. COMPARISON:  CT 11/10/2019 FINDINGS: Uterus Measurements: 8.5 x 5.1 x 5.9 cm = volume: 131 mL. Rounded heterogeneous mass in the uterine fundus measuring 3.3 cm consistent with intramural leiomyoma. Endometrium Thickness: Normal thickness for premenopausal female at 4.3 mm. No focal abnormality visualized. Right ovary Measurements: Not identified due to overlying bowel gas and limitation due to body habitus. = Left ovary Measurements: Not identified due to overlying bowel gas and body habitus. Other findings No free fluid. IMPRESSION: 1. No acute findings on pelvic ultrasound 2. Leiomyoma within the uterine fundus. 3. Normal endometrium. 4. Ovaries not identified due to technical difficulty of  overlying bowel gas and large body habitus. Of note normal ovaries are present on CT 11/10/2019. Electronically Signed   By: Suzy Bouchard M.D.   On: 01/11/2020 11:47   DG  Hip Unilat With Pelvis 2-3 Views Left  Result Date: 01/11/2020 CLINICAL DATA:  Pain EXAM: DG HIP (WITH OR WITHOUT PELVIS) 2-3V LEFT COMPARISON:  September 02, 2019 FINDINGS: Frontal pelvis as well as frontal and lateral left hip images were obtained. There is no acute fracture or dislocation. Narrowing of the left hip joint is stable. Right hip joint appears unremarkable. 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 Sacroiliac joints appear normal bilaterally. IMPRESSION: No acute fracture or dislocation. Narrowing left hip joint, slightly progressed from prior study. 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 No erosive change. Electronically Signed   By: Lowella Grip III M.D.   On: 01/11/2020 11:42    Procedures Procedures (including critical care time)  Medications Ordered in ED Medications - No data to display  ED Course  I have reviewed the triage vital signs and the nursing notes.  Pertinent labs & imaging results that were available during my care of the patient were reviewed by me and considered in my medical decision making (see chart for details).    MDM Rules/Calculators/A&P                      Patient presents to the ED with multiple complaints. Nontoxic, resting comfortably, vitals WNL.  - Sore throat- strep positive, no signs of RPA/PTA, will tx w/ IM bicillin in the ED.  - L hip pain- no signs of infection, xray without acute fracture/dislocation, narrowing as above. Will DC meloxicam and try BID naproxen & robaxin. 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 Ortho follow up.  - Urinary sxs/vaginal discharge/pelvic pain- mild L suprapubic tenderness, no other significant abdominal tenderness or peritoneal signs- do not suspect acute surgical abdomen. No  UTI on UA. Wet prep with BV. Mild L adnexal tenderness--> US obtained no acute findings, fibroid present, ovaries not identified due to technical difficulty- do not suspect torsion, limited exam limits eval for TOA but suspicion for this is very low given patients well appearance. Not classic for PID but considering this given patient with recent unprotected sex and adnexal tenderness, discussed with patient, in agreement with covering for this with abx in the ED & @ discharge. Discussed importance of protection when sexually active & need to inform all sexual partners if positive. GC/chlamydia/RPR/HIV pending.   I discussed results, treatment plan, need for follow-up, and return precautions with the patient. Provided opportunity for questions, patient confirmed understanding and is in agreement with plan.   Final Clinical Impression(s) / ED Diagnoses Final diagnoses:  Strep throat  Bacterial vaginosis  Left hip pain  Fibroid    Rx / DC Orders ED Discharge Orders         Ordered    naproxen (NAPROSYN) 500 MG tablet  2 times daily     01/11/20 1216    methocarbamol (ROBAXIN) 500 MG tablet  2 times daily PRN     01/11/20 1216    metroNIDAZOLE (FLAGYL) 500 MG tablet  2 times daily     01/11/20 1216    doxycycline (VIBRAMYCIN) 100 MG capsule  2 times daily     01/11/20 [address] [place], PA-C 01/11/20 1224    Davonna Belling, MD 01/12/20 [phone]

## 2020-01-12 LAB — URINE CULTURE: Culture: <10000 — AB

## 2020-01-12 LAB — RPR: RPR Ser Ql: NONREACTIVE

## 2020-01-12 LAB — GC/CHLAMYDIA PROBE AMP (~~LOC~~) NOT AT ARMC
Chlamydia: NEGATIVE
Neisseria Gonorrhea: NEGATIVE

## 2020-02-04 ENCOUNTER — Other Ambulatory Visit: Payer: Self-pay

## 2020-02-04 ENCOUNTER — Ambulatory Visit
Admission: RE | Admit: 2020-02-04 | Discharge: 2020-02-04 | Disposition: A | Discharge status: Home / Self Care | Payer: No Typology Code available for payment source | Source: Ambulatory Visit | Attending: Orthopedic Surgery | Admitting: Orthopedic Surgery

## 2020-02-04 DIAGNOSIS — M1612 Unilateral primary osteoarthritis, left hip: Secondary | ICD-10-CM

## 2020-02-04 MED ORDER — IOPAMIDOL (ISOVUE-M 200) INJECTION 41%
1.0000 mL | Freq: Once | INTRAMUSCULAR | Status: AC
  Administered 2020-02-04: 1 mL via INTRA_ARTICULAR

## 2020-02-04 MED ORDER — METHYLPREDNISOLONE ACETATE 40 MG/ML INJ SUSP (RADIOLOG
120.0000 mg | Freq: Once | INTRAMUSCULAR | Status: AC
  Administered 2020-02-04: 120 mg via INTRA_ARTICULAR

## 2020-07-03 ENCOUNTER — Emergency Department (HOSPITAL_COMMUNITY)
Admission: EM | Admit: 2020-07-03 | Discharge: 2020-07-03 | Disposition: A | Discharge status: Home / Self Care | Payer: Self-pay | Attending: Emergency Medicine | Admitting: Emergency Medicine

## 2020-07-03 ENCOUNTER — Encounter (HOSPITAL_COMMUNITY): Payer: Self-pay

## 2020-07-03 ENCOUNTER — Other Ambulatory Visit: Payer: Self-pay

## 2020-07-03 DIAGNOSIS — K0889 Other specified disorders of teeth and supporting structures: Secondary | ICD-10-CM | POA: Insufficient documentation

## 2020-07-03 DIAGNOSIS — M79662 Pain in left lower leg: Secondary | ICD-10-CM | POA: Insufficient documentation

## 2020-07-03 DIAGNOSIS — F17210 Nicotine dependence, cigarettes, uncomplicated: Secondary | ICD-10-CM | POA: Insufficient documentation

## 2020-07-03 DIAGNOSIS — M79604 Pain in right leg: Secondary | ICD-10-CM

## 2020-07-03 DIAGNOSIS — M79661 Pain in right lower leg: Secondary | ICD-10-CM | POA: Insufficient documentation

## 2020-07-03 DIAGNOSIS — Z79899 Other long term (current) drug therapy: Secondary | ICD-10-CM | POA: Insufficient documentation

## 2020-07-03 MED ORDER — PREDNISONE 10 MG PO TABS: ORAL_TABLET | ORAL | 0 refills | Status: DC

## 2020-07-03 MED ORDER — HYDROCODONE-ACETAMINOPHEN 5-325 MG PO TABS: ORAL_TABLET | ORAL | 0 refills | Status: DC

## 2020-07-03 MED ORDER — CLINDAMYCIN HCL 300 MG PO CAPS: 300.0000 mg | ORAL_CAPSULE | Freq: Three times a day (TID) | ORAL | 0 refills | Status: DC

## 2020-07-03 NOTE — ED Triage Notes (Addendum)
Pt reports that both her legs has been hurting for 2 weeks. Described as heat feeling going up legs and worse at HS.  Heat feeling also is in right hand( 3 fingers) that goes up arm, but doesn't happen as often  Also reports left sided upper and lower dental pain

## 2020-07-03 NOTE — Discharge Instructions (Signed)
Take the antibiotic as directed.  You will need to see your dentist soon.  Also, you are scheduled to have an ultrasound test called an ABI.  You may call the radiology scheduling department at [phone] to arrange an appointment to have the ultrasound done.  You will need to follow-up with your primary care provider after your ultrasound.

## 2020-07-03 NOTE — ED Notes (Signed)
Pt complains of bilateral knee pain x 2 weeks as well as dental pain   She is morbidly obese and ambulates heel to toe

## 2020-07-04 ENCOUNTER — Ambulatory Visit (HOSPITAL_COMMUNITY): Admission: RE | Admit: 2020-07-04 | Payer: Self-pay | Source: Ambulatory Visit

## 2020-07-07 NOTE — ED Provider Notes (Signed)
[facility] EMERGENCY DEPARTMENT Provider Note   CSN: [id number] Arrival date & time: 07/03/20  3154     History Chief Complaint  Patient presents with  . Leg Pain  . Dental Pain    Marie Barnes is a 44 y.o. female.  HPI      Marie Barnes is a 44 y.o. female who presents to the Emergency Department complaining of burning sensation to both lower extremities.  Symptoms present for 2 weeks.  She describes a burning sensation to several areas of both legs.  She also complains of a similar sensation to her right hand, mostly the index, middle and ring fingers.  Symptoms of the right hand are intermittent.  No injuries, swelling, rash, back pain, urine or bowel changes.  No dysuria.  She also complains of left upper and lower dental pain for several days, gradually worsening.  unable to see a dentist.  No fever, chills, facial swelling or neck pain.    Past Medical History:  Diagnosis Date  . Closed posterior dislocation of left hip ([place]) 07/03/2019  . Marijuana abuse 07/22/2019  . Nicotine dependence 07/03/2019  . Vitamin D deficiency 07/22/2019    Patient Active Problem List   Diagnosis Date Noted  . Cocaine use 07/22/2019  . Vitamin D deficiency 07/22/2019  . Marijuana abuse 07/22/2019  . Alcohol intoxication ([place]) 07/22/2019  . Closed posterior dislocation of left hip ([place]) 07/03/2019  . Nicotine dependence 07/03/2019  . Fracture of femoral head ([place]) 07/02/2019    Past Surgical History:  Procedure Laterality Date  . TUBAL LIGATION       OB History    Gravida      Para      Term      Preterm      AB      Living  3     SAB      TAB      Ectopic      Multiple      Live Births              Family History  Problem Relation Age of Onset  . Diabetes Other     Social History   Tobacco Use  . Smoking status: Current Every Day Smoker    Packs/day: 1.00    Types: Cigarettes  . Smokeless tobacco: Never Used  Vaping Use  . Vaping Use:  Never used  Substance Use Topics  . Alcohol use: Yes    Comment: occasional social drinker  . Drug use: Yes    Types: Marijuana    Comment: denies    Home Medications Prior to Admission medications   Medication Sig Start Date End Date Taking? Authorizing Provider  acetaminophen (TYLENOL) 500 MG tablet Take 1 tablet (500 mg total) by mouth every 8 (eight) hours as needed. Patient taking differently: Take 500 mg by mouth every 8 (eight) hours as needed for mild pain or moderate pain.  07/03/19   Ainsley Spinner, PA-C  clindamycin (CLEOCIN) 300 MG capsule Take 1 capsule (300 mg total) by mouth 3 (three) times daily. 07/03/20   Elpidio Thielen, PA-C  doxycycline (VIBRAMYCIN) 100 MG capsule Take 1 capsule (100 mg total) by mouth 2 (two) times daily. 01/11/20   Petrucelli, Glynda Jaeger, PA-C  HYDROcodone-acetaminophen (NORCO/VICODIN) 5-325 MG tablet Take one tab po q 4 hrs prn pain 07/03/20   Baillie Mohammad, PA-C  methocarbamol (ROBAXIN) 500 MG tablet Take 1 tablet (500 mg total) by mouth 2 (two) times daily as  needed for muscle spasms. 01/11/20   Petrucelli, Samantha R, PA-C  metroNIDAZOLE (FLAGYL) 500 MG tablet Take 1 tablet (500 mg total) by mouth 2 (two) times daily. 01/11/20   Petrucelli, Samantha R, PA-C  naproxen (NAPROSYN) 500 MG tablet Take 1 tablet (500 mg total) by mouth 2 (two) times daily. 01/11/20   Petrucelli, Samantha R, PA-C  OVER THE COUNTER MEDICATION Take 1 tablet by mouth. Thyroid complex    [provider]  predniSONE (DELTASONE) 10 MG tablet Take 6 tablets day one, 5 tablets day two, 4 tablets day three, 3 tablets day four, 2 tablets day five, then 1 tablet day six 07/03/20   Sianne Tejada, PA-C    Allergies    Asa [aspirin] and Penicillins  Review of Systems   Review of Systems  Constitutional: Negative for appetite change and fever.  HENT: Positive for dental problem. Negative for congestion, facial swelling, sore throat and trouble swallowing.   Eyes: Negative for pain  and visual disturbance.  Respiratory: Negative for shortness of breath.   Cardiovascular: Negative for chest pain.  Gastrointestinal: Negative for abdominal pain, nausea and vomiting.  Genitourinary: Negative for dysuria and flank pain.  Musculoskeletal: Positive for myalgias. Negative for back pain, neck pain and neck stiffness.  Neurological: Negative for dizziness, facial asymmetry, weakness, numbness and headaches.  Hematological: Negative for adenopathy.    Physical Exam Updated Vital Signs BP 123/71 (BP Location: Right Arm)   Pulse 64   Temp 97.9 F (36.6 C) (Oral)   Resp 18   Ht 5\' 6"  (1.676 m)   Wt 104.3 kg   LMP 07/01/2020   SpO2 99%   BMI 37.12 kg/m   Physical Exam Vitals and nursing note reviewed.  Constitutional:      General: She is not in acute distress.    Appearance: Normal appearance. She is well-developed. She is not ill-appearing or toxic-appearing.  HENT:     Head:     Jaw: No trismus.     Right Ear: Tympanic membrane and ear canal normal.     Left Ear: Tympanic membrane and ear canal normal.     Mouth/Throat:     Mouth: Mucous membranes are moist.     Dentition: Dental caries present. No dental abscesses.     Pharynx: Uvula midline. No uvula swelling.     Comments: ttp of several upper and lower molars and premolars.  Multiple dental caries.  No edema or fluctuance of the gums.  No trismus.  Uvula midline and non edematous Cardiovascular:     Rate and Rhythm: Normal rate and regular rhythm.     Pulses: Normal pulses.  Pulmonary:     Effort: Pulmonary effort is normal.     Breath sounds: Normal breath sounds.  Abdominal:     General: There is no distension.     Palpations: Abdomen is soft.     Tenderness: There is no abdominal tenderness.  Musculoskeletal:        General: No signs of injury. Normal range of motion.     Cervical back: Normal range of motion. No tenderness.     Right lower leg: No edema.     Left lower leg: No edema.      Comments: No midline spinal tenderness.    Lymphadenopathy:     Cervical: No cervical adenopathy.  Skin:    General: Skin is warm.     Capillary Refill: Capillary refill takes less than 2 seconds.     Findings: No  rash.  Neurological:     General: No focal deficit present.     Mental Status: She is alert.     Sensory: Sensation is intact. No sensory deficit.     Motor: Motor function is intact. No weakness or abnormal muscle tone.     Coordination: Coordination is intact.     Gait: Gait is intact.     Comments: 5/5 muscle strength of bilateral upper and lower extremities     ED Results / Procedures / Treatments   Labs (all labs ordered are listed, but only abnormal results are displayed) Labs Reviewed - No data to display  EKG None  Radiology No results found.  Procedures Procedures (including critical care time)  Medications Ordered in ED Medications - No data to display  ED Course  I have reviewed the triage vital signs and the nursing notes.  Pertinent labs & imaging results that were available during my care of the patient were reviewed by me and considered in my medical decision making (see chart for details).    MDM Rules/Calculators/A&P                          Pt with burning sensation of scattered areas of BLE's.  No neurological deficits on exam and no skin changes.  Intermittent symptoms to the right hand as well.  Possible PVD vs neuritis.  No pulselessness, skin changes, or paralysis.  She also has dental pain secondary to decay, no abscess.  No trismus.  No concerning sx's for ludewig's angina.    I will schedule pt for outpatient ABI's.  Agrees to close PCP f/u.  No concerning sx's for emergent process.  Will also treat with abx for dental pain.      Final Clinical Impression(s) / ED Diagnoses Final diagnoses:  Pain in both lower extremities  Pain, dental    Rx / DC Orders ED Discharge Orders         Ordered    US ARTERIAL ABI (SCREENING LOWER  EXTREMITY)        07/03/20 1211    HYDROcodone-acetaminophen (NORCO/VICODIN) 5-325 MG tablet        07/03/20 1216    predniSONE (DELTASONE) 10 MG tablet        07/03/20 1216    clindamycin (CLEOCIN) 300 MG capsule  3 times daily        07/03/20 1216           Kem Parkinson, PA-C 07/07/20 1559    Milton Ferguson, MD 07/08/20 [phone]

## 2020-07-22 ENCOUNTER — Emergency Department (HOSPITAL_COMMUNITY)
Admission: EM | Admit: 2020-07-22 | Discharge: 2020-07-22 | Disposition: A | Discharge status: Home / Self Care | Payer: Medicaid Other | Attending: Emergency Medicine | Admitting: Emergency Medicine

## 2020-07-22 ENCOUNTER — Other Ambulatory Visit: Payer: Self-pay

## 2020-07-22 ENCOUNTER — Encounter (HOSPITAL_COMMUNITY): Payer: Self-pay | Admitting: *Deleted

## 2020-07-22 DIAGNOSIS — N3001 Acute cystitis with hematuria: Secondary | ICD-10-CM | POA: Insufficient documentation

## 2020-07-22 DIAGNOSIS — F17210 Nicotine dependence, cigarettes, uncomplicated: Secondary | ICD-10-CM | POA: Insufficient documentation

## 2020-07-22 LAB — URINALYSIS, ROUTINE W REFLEX MICROSCOPIC
Bilirubin Urine: NEGATIVE
Glucose, UA: NEGATIVE mg/dL
Ketones, ur: NEGATIVE mg/dL
Leukocytes,Ua: NEGATIVE
Nitrite: NEGATIVE
Protein, ur: NEGATIVE mg/dL
RBC / HPF: >50 RBC/hpf — ABNORMAL HIGH (ref 0–5)
Specific Gravity, Urine: 1.002 — ABNORMAL LOW (ref 1.005–1.030)
pH: 7 (ref 5.0–8.0)

## 2020-07-22 LAB — PREGNANCY, URINE: Preg Test, Ur: NEGATIVE

## 2020-07-22 MED ORDER — CEPHALEXIN 500 MG PO CAPS: 500.0000 mg | ORAL_CAPSULE | Freq: Two times a day (BID) | ORAL | 0 refills | Status: AC

## 2020-07-22 MED ORDER — CEPHALEXIN 500 MG PO CAPS: 500.0000 mg | ORAL_CAPSULE | Freq: Once | ORAL | Status: DC

## 2020-07-22 MED ORDER — CEPHALEXIN 250 MG PO CAPS: 250.0000 mg | ORAL_CAPSULE | Freq: Once | ORAL | Status: DC

## 2020-07-22 MED ORDER — CEPHALEXIN 500 MG PO CAPS
500.0000 mg | ORAL_CAPSULE | Freq: Once | ORAL | Status: AC
  Administered 2020-07-22: 500 mg via ORAL
  Filled 2020-07-22: qty 1

## 2020-07-22 NOTE — ED Triage Notes (Signed)
Pain in right hip, lower abdomen and thinks she may have a UTI

## 2020-07-22 NOTE — Discharge Instructions (Signed)
Take the entire course of the antibiotics prescribed, taking your next dose tonight .  Make sure you are drinking plenty of fluids. Get rechecked for any new or worsened symptoms.  As discussed, you may need to consider a recheck by Dr Marcelino Scot as well if you continue to have pain in your left hip.

## 2020-07-22 NOTE — ED Notes (Signed)
Pt believes she has a UTI  Here for eval for same   Reports sx x 1 week but has n o physician   Will need a referral

## 2020-07-23 LAB — URINE CULTURE

## 2020-07-24 NOTE — ED Provider Notes (Signed)
[facility] Provider Note   CSN: [id number] Arrival date & time: 07/22/20  1037     History Chief Complaint  Patient presents with  . Dysuria    Marie Barnes is a 43 y.o. female presenting with dysuria and hematuria along with suprapubic pressure sensation and frequent need to urinate for the past several days.  She denies flank or back pain, has no fevers, chills, n/v or vaginal complaints.  She does endorse chronic left hip pain which she has experienced since her last hip dislocation last fall. She denies any new injury, however has a new job requiring standing and walking.  She has used otc azo without urinary improvement.  Denies kidney stone hx.   HPI     Past Medical History:  Diagnosis Date  . Closed posterior dislocation of left hip ([place]) 07/03/2019  . Marijuana abuse 07/22/2019  . Nicotine dependence 07/03/2019  . Vitamin D deficiency 07/22/2019    Patient Active Problem List   Diagnosis Date Noted  . Cocaine use 07/22/2019  . Vitamin D deficiency 07/22/2019  . Marijuana abuse 07/22/2019  . Alcohol intoxication ([place]) 07/22/2019  . Closed posterior dislocation of left hip ([place]) 07/03/2019  . Nicotine dependence 07/03/2019  . Fracture of femoral head ([place]) 07/02/2019    Past Surgical History:  Procedure Laterality Date  . TUBAL LIGATION       OB History    Gravida      Para      Term      Preterm      AB      Living  3     SAB      TAB      Ectopic      Multiple      Live Births              Family History  Problem Relation Age of Onset  . Diabetes Other     Social History   Tobacco Use  . Smoking status: Current Every Day Smoker    Packs/day: 1.00    Types: Cigarettes  . Smokeless tobacco: Never Used  Vaping Use  . Vaping Use: Never used  Substance Use Topics  . Alcohol use: Yes    Comment: occasional social drinker  . Drug use: Yes    Types: Marijuana    Comment: denies    Home  Medications Prior to Admission medications   Medication Sig Start Date End Date Taking? Authorizing Provider  acetaminophen (TYLENOL) 500 MG tablet Take 1 tablet (500 mg total) by mouth every 8 (eight) hours as needed. Patient taking differently: Take 500 mg by mouth every 8 (eight) hours as needed for mild pain or moderate pain.  07/03/19   Ainsley Spinner, PA-C  cephALEXin (KEFLEX) 500 MG capsule Take 1 capsule (500 mg total) by mouth 2 (two) times daily for 7 days. 07/22/20 07/29/20  Evalee Jefferson, PA-C  doxycycline (VIBRAMYCIN) 100 MG capsule Take 1 capsule (100 mg total) by mouth 2 (two) times daily. 01/11/20   Petrucelli, Glynda Jaeger, PA-C  HYDROcodone-acetaminophen (NORCO/VICODIN) 5-325 MG tablet Take one tab po q 4 hrs prn pain 07/03/20   Triplett, Tammy, PA-C  methocarbamol (ROBAXIN) 500 MG tablet Take 1 tablet (500 mg total) by mouth 2 (two) times daily as needed for muscle spasms. 01/11/20   Petrucelli, Samantha R, PA-C  naproxen (NAPROSYN) 500 MG tablet Take 1 tablet (500 mg total) by mouth 2 (two) times daily. 01/11/20   Petrucelli, Aldona Bar  R, PA-C  OVER THE COUNTER MEDICATION Take 1 tablet by mouth. Thyroid complex    [provider]  predniSONE (DELTASONE) 10 MG tablet Take 6 tablets day one, 5 tablets day two, 4 tablets day three, 3 tablets day four, 2 tablets day five, then 1 tablet day six 07/03/20   Triplett, Tammy, PA-C    Allergies    Asa [aspirin] and Penicillins  Review of Systems   Review of Systems  Constitutional: Negative for chills and fever.  HENT: Negative for congestion.   Eyes: Negative.   Respiratory: Negative for chest tightness and shortness of breath.   Cardiovascular: Negative for chest pain.  Gastrointestinal: Negative for abdominal pain, nausea and vomiting.  Genitourinary: Positive for dysuria, frequency, hematuria and urgency.  Musculoskeletal: Positive for arthralgias. Negative for joint swelling and neck pain.  Skin: Negative.  Negative for rash and  wound.  Neurological: Negative for dizziness, weakness, light-headedness, numbness and headaches.  Psychiatric/Behavioral: Negative.   All other systems reviewed and are negative.   Physical Exam Updated Vital Signs BP 108/66 (BP Location: Right Arm)   Pulse 69   Temp 98.5 F (36.9 C)   Resp 16   Ht 5\' 6"  (1.676 m)   Wt (!) 225 kg   LMP 07/01/2020   SpO2 100%   BMI 80.06 kg/m   Physical Exam Vitals and nursing note reviewed.  Constitutional:      Appearance: She is well-developed.  HENT:     Head: Normocephalic and atraumatic.  Eyes:     Conjunctiva/sclera: Conjunctivae normal.  Cardiovascular:     Rate and Rhythm: Normal rate and regular rhythm.     Heart sounds: Normal heart sounds.  Pulmonary:     Effort: Pulmonary effort is normal.     Breath sounds: Normal breath sounds. No wheezing.  Abdominal:     General: Bowel sounds are normal. There is no distension.     Palpations: Abdomen is soft.     Tenderness: There is no abdominal tenderness. There is no right CVA tenderness, left CVA tenderness or guarding.  Musculoskeletal:        General: Normal range of motion.     Cervical back: Normal range of motion.  Skin:    General: Skin is warm and dry.  Neurological:     General: No focal deficit present.     Mental Status: She is alert.     ED Results / Procedures / Treatments   Labs (all labs ordered are listed, but only abnormal results are displayed) Labs Reviewed  URINE CULTURE - Abnormal; Notable for the following components:      Result Value   Culture MULTIPLE SPECIES PRESENT, SUGGEST RECOLLECTION (*)    All other components within normal limits  URINALYSIS, ROUTINE W REFLEX MICROSCOPIC - Abnormal; Notable for the following components:   Specific Gravity, Urine 1.002 (*)    Hgb urine dipstick LARGE (*)    RBC / HPF >50 (*)    Bacteria, UA RARE (*)    All other components within normal limits  PREGNANCY, URINE    EKG None  Radiology No results  found.  Procedures Procedures (including critical care time)  Medications Ordered in ED Medications  cephALEXin (KEFLEX) capsule 500 mg (500 mg Oral Given 07/22/20 1259)    ED Course  I have reviewed the triage vital signs and the nursing notes.  Pertinent labs & imaging results that were available during my care of the patient were reviewed by me and  considered in my medical decision making (see chart for details).    MDM Rules/Calculators/A&P                          Pt with large amount of hematuria, with dysuria sx, will cover with abx, keflex started.  Discussed home tx, increased fluids, return precautions discussed.  Urine cx pending.  Referral to urology for f/u care esp if hematuria does not improve.    Hip pain chronic, unchanged today, no new injury , recommend f/u with her orthopedist for further eval.  Final Clinical Impression(s) / ED Diagnoses Final diagnoses:  Acute cystitis with hematuria    Rx / DC Orders ED Discharge Orders         Ordered    cephALEXin (KEFLEX) 500 MG capsule  2 times daily        07/22/20 1239           Evalee Jefferson, PA-C 07/24/20 1255    Noemi Chapel, MD 07/30/20 1836

## 2020-08-31 ENCOUNTER — Emergency Department (HOSPITAL_COMMUNITY)
Admission: EM | Admit: 2020-08-31 | Discharge: 2020-08-31 | Disposition: A | Discharge status: Home / Self Care | Payer: Self-pay | Attending: Emergency Medicine | Admitting: Emergency Medicine

## 2020-08-31 ENCOUNTER — Encounter (HOSPITAL_COMMUNITY): Payer: Self-pay | Admitting: Emergency Medicine

## 2020-08-31 ENCOUNTER — Other Ambulatory Visit: Payer: Self-pay

## 2020-08-31 DIAGNOSIS — Z87891 Personal history of nicotine dependence: Secondary | ICD-10-CM | POA: Insufficient documentation

## 2020-08-31 DIAGNOSIS — R319 Hematuria, unspecified: Secondary | ICD-10-CM | POA: Insufficient documentation

## 2020-08-31 DIAGNOSIS — R300 Dysuria: Secondary | ICD-10-CM | POA: Insufficient documentation

## 2020-08-31 LAB — URINALYSIS, ROUTINE W REFLEX MICROSCOPIC
Bacteria, UA: NONE SEEN
Bilirubin Urine: NEGATIVE
Glucose, UA: NEGATIVE mg/dL
Ketones, ur: NEGATIVE mg/dL
Leukocytes,Ua: NEGATIVE
Nitrite: NEGATIVE
Protein, ur: NEGATIVE mg/dL
Specific Gravity, Urine: 1.009 (ref 1.005–1.030)
pH: 6 (ref 5.0–8.0)

## 2020-08-31 LAB — PREGNANCY, URINE: Preg Test, Ur: NEGATIVE
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
## 2020-08-31 MED ORDER — DOXYCYCLINE HYCLATE 100 MG PO CAPS: 100.0000 mg | ORAL_CAPSULE | Freq: Two times a day (BID) | ORAL | 0 refills | Status: DC

## 2020-08-31 NOTE — Clinical Social Work Note (Signed)
Transition of Care [facility]) - Emergency Department Mini Assessment   Patient Details  Name: Marie Barnes MRN: [id number] Date of Birth: 12-Mar-1976  Transition of Care [facility]) CM/SW Contact:    Iona Beard, [place] Phone Number: 08/31/2020, 7:05 PM   Clinical Narrative: CSW visited pt in ED due to pt having no insurance or PCP. CSW explained [facility] referral process. Pt understanding and provided with copy or [facility] information. CSW also explained a referral could be made to financial counselor. Pt is understanding and accepting of both referrals being made. TOC signing off.   ED Mini Assessment: What brought you to the Emergency Department? : Hematuria  Barriers to Discharge: ED PCP establishment, Inadequate or no insurance  Barrier interventions: Careers information officer referral  Means of departure: Car  Interventions which prevented an admission or readmission: Other (must enter comment) Animal nutritionist and financial counselor referral)  Patient Adult nurse 1: [facility] Key Contact 2: Financial counselor Tito Dine)   Contact Date: 08/31/20,          Patient states their goals for this hospitalization and ongoing recovery are:: Return home   Choice offered to / list presented to : NA  Admission diagnosis:  Blood in urine  Patient Active Problem List   Diagnosis Date Noted  . Cocaine use 07/22/2019  . Vitamin D deficiency 07/22/2019  . Marijuana abuse 07/22/2019  . Alcohol intoxication ([place]) 07/22/2019  . Closed posterior dislocation of left hip ([place]) 07/03/2019  . Nicotine dependence 07/03/2019  . Fracture of femoral head ([place]) 07/02/2019   PCP:  Patient, No Pcp Per Pharmacy:   [place] [place] [facility] [place] [address] [place] [address] Phone: [phone] Fax: [phone]  [place], [place] [address] Phone: [phone] Fax:  [phone]  [place] [address] Phone: [phone] Fax: [phone]  [id number] - [place] [place] [address] Phone: [phone] Fax: [phone]

## 2020-08-31 NOTE — Discharge Instructions (Signed)
Follow-up with Dr. Elonda Husky in 1-2 weeks

## 2020-08-31 NOTE — ED Provider Notes (Signed)
[facility] EMERGENCY DEPARTMENT Provider Note   CSN: [id number] Arrival date & time: 08/31/20  1641     History Chief Complaint  Patient presents with  . Hematuria    Marie Barnes is a 44 y.o. female.  Patient complains of dysuria and hematuria.  Patient had UTI last month and was treated with Keflex  The history is provided by the patient and medical records. No language interpreter was used.  Hematuria This is a new problem. The current episode started less than 1 hour ago. The problem occurs constantly. The problem has not changed since onset.Pertinent negatives include no chest pain, no abdominal pain and no headaches. Nothing aggravates the symptoms. She has tried nothing for the symptoms. The treatment provided no relief.       Past Medical History:  Diagnosis Date  . Closed posterior dislocation of left hip ([place]) 07/03/2019  . Marijuana abuse 07/22/2019  . Nicotine dependence 07/03/2019  . Vitamin D deficiency 07/22/2019    Patient Active Problem List   Diagnosis Date Noted  . Cocaine use 07/22/2019  . Vitamin D deficiency 07/22/2019  . Marijuana abuse 07/22/2019  . Alcohol intoxication ([place]) 07/22/2019  . Closed posterior dislocation of left hip ([place]) 07/03/2019  . Nicotine dependence 07/03/2019  . Fracture of femoral head ([place]) 07/02/2019    Past Surgical History:  Procedure Laterality Date  . TUBAL LIGATION       OB History    Gravida      Para      Term      Preterm      AB      Living  3     SAB      TAB      Ectopic      Multiple      Live Births              Family History  Problem Relation Age of Onset  . Diabetes Other     Social History   Tobacco Use  . Smoking status: Former Smoker    Packs/day: 1.00    Types: Cigarettes  . Smokeless tobacco: Never Used  Vaping Use  . Vaping Use: Never used  Substance Use Topics  . Alcohol use: Yes    Comment: occasional social drinker  . Drug use: Yes    Types:  Marijuana    Comment: denies    Home Medications Prior to Admission medications   Medication Sig Start Date End Date Taking? Authorizing Provider  acetaminophen (TYLENOL) 500 MG tablet Take 1 tablet (500 mg total) by mouth every 8 (eight) hours as needed. Patient taking differently: Take 500 mg by mouth every 8 (eight) hours as needed for mild pain or moderate pain.  07/03/19   Ainsley Spinner, PA-C  doxycycline (VIBRAMYCIN) 100 MG capsule Take 1 capsule (100 mg total) by mouth 2 (two) times daily. One po bid x 7 days 08/31/20   Milton Ferguson, MD  HYDROcodone-acetaminophen (NORCO/VICODIN) 5-325 MG tablet Take one tab po q 4 hrs prn pain 07/03/20   Triplett, Tammy, PA-C  methocarbamol (ROBAXIN) 500 MG tablet Take 1 tablet (500 mg total) by mouth 2 (two) times daily as needed for muscle spasms. 01/11/20   Petrucelli, Samantha R, PA-C  naproxen (NAPROSYN) 500 MG tablet Take 1 tablet (500 mg total) by mouth 2 (two) times daily. 01/11/20   Petrucelli, Samantha R, PA-C  OVER THE COUNTER MEDICATION Take 1 tablet by mouth. Thyroid complex    [provider]  predniSONE (DELTASONE) 10 MG tablet Take 6 tablets day one, 5 tablets day two, 4 tablets day three, 3 tablets day four, 2 tablets day five, then 1 tablet day six 07/03/20   Triplett, Tammy, PA-C    Allergies    Asa [aspirin] and Penicillins  Review of Systems   Review of Systems  Constitutional: Negative for appetite change and fatigue.  HENT: Negative for congestion, ear discharge and sinus pressure.   Eyes: Negative for discharge.  Respiratory: Negative for cough.   Cardiovascular: Negative for chest pain.  Gastrointestinal: Negative for abdominal pain and diarrhea.  Genitourinary: Positive for hematuria. Negative for frequency.  Musculoskeletal: Negative for back pain.  Skin: Negative for rash.  Neurological: Negative for seizures and headaches.  Psychiatric/Behavioral: Negative for hallucinations.    Physical Exam Updated Vital  Signs BP (!) 150/71 (BP Location: Right Arm)   Pulse 82   Temp 98 F (36.7 C) (Oral)   Resp 18   Ht 5\' 6"  (1.676 m)   Wt 90.7 kg   LMP 08/17/2020   SpO2 99%   BMI 32.28 kg/m   Physical Exam Vitals reviewed.  Constitutional:      Appearance: She is well-developed.  HENT:     Head: Normocephalic.     Nose: Nose normal.  Eyes:     General: No scleral icterus.    Conjunctiva/sclera: Conjunctivae normal.  Neck:     Thyroid: No thyromegaly.  Cardiovascular:     Rate and Rhythm: Normal rate and regular rhythm.     Heart sounds: No murmur heard.  No friction rub. No gallop.   Pulmonary:     Breath sounds: No stridor. No wheezing or rales.  Chest:     Chest wall: No tenderness.  Abdominal:     General: There is no distension.     Tenderness: There is no abdominal tenderness. There is no rebound.  Musculoskeletal:        General: Normal range of motion.     Cervical back: Neck supple.  Lymphadenopathy:     Cervical: No cervical adenopathy.  Skin:    Findings: No erythema or rash.  Neurological:     Mental Status: She is oriented to person, place, and time.     Motor: No abnormal muscle tone.     Coordination: Coordination normal.  Psychiatric:        Behavior: Behavior normal.     ED Results / Procedures / Treatments   Labs (all labs ordered are listed, but only abnormal results are displayed) Labs Reviewed  URINALYSIS, ROUTINE W REFLEX MICROSCOPIC - Abnormal; Notable for the following components:      Result Value   Hgb urine dipstick LARGE (*)    All other components within normal limits  URINE CULTURE  PREGNANCY, URINE    EKG None  Radiology No results found.  Procedures Procedures (including critical care time)  Medications Ordered in ED Medications - No data to display  ED Course  I have reviewed the triage vital signs and the nursing notes.  Pertinent labs & imaging results that were available during my care of the patient were reviewed by me  and considered in my medical decision making (see chart for details).    MDM Rules/Calculators/A&P                          Patient with dysuria and history of hematuria.  Patient will get a urine culture done  and will be started on Bactrim will follow up with OB/GYN      This patient presents to the ED for concern of hematuria, this involves an extensive number of treatment options, and is a complaint that carries with it a high risk of complications and morbidity.  The differential diagnosis includes UTI   Lab Tests:   I Ordered, reviewed, and interpreted labs, which included urinalysis which was dip positive for blood and pregnancy with negative  Medicines ordered:   I ordered medication Bactrim prescription  Imaging Studies ordered:       Additional history obtained:   Additional history obtained from old records  Previous records obtained and reviewed.  Consultations Obtained:     Reevaluation:  After the interventions stated above, I reevaluated the patient and found no change  Critical Interventions:  .   Final Clinical Impression(s) / ED Diagnoses Final diagnoses:  Dysuria    Rx / DC Orders ED Discharge Orders         Ordered    doxycycline (VIBRAMYCIN) 100 MG capsule  2 times daily        08/31/20 1851           Milton Ferguson, MD 08/31/20 1857

## 2020-08-31 NOTE — ED Triage Notes (Signed)
Pt reports hematuria and dysuria since last night. nad noted.

## 2020-09-02 LAB — URINE CULTURE

## 2021-01-15 ENCOUNTER — Encounter (HOSPITAL_COMMUNITY): Payer: Self-pay

## 2021-01-15 ENCOUNTER — Emergency Department (HOSPITAL_COMMUNITY)
Admission: EM | Admit: 2021-01-15 | Discharge: 2021-01-15 | Disposition: A | Discharge status: Home / Self Care | Payer: Self-pay | Attending: Emergency Medicine | Admitting: Emergency Medicine

## 2021-01-15 ENCOUNTER — Other Ambulatory Visit: Payer: Self-pay

## 2021-01-15 DIAGNOSIS — K047 Periapical abscess without sinus: Secondary | ICD-10-CM

## 2021-01-15 DIAGNOSIS — K029 Dental caries, unspecified: Secondary | ICD-10-CM | POA: Insufficient documentation

## 2021-01-15 DIAGNOSIS — Z87891 Personal history of nicotine dependence: Secondary | ICD-10-CM | POA: Insufficient documentation

## 2021-01-15 DIAGNOSIS — M25552 Pain in left hip: Secondary | ICD-10-CM | POA: Insufficient documentation

## 2021-01-15 DIAGNOSIS — R109 Unspecified abdominal pain: Secondary | ICD-10-CM | POA: Insufficient documentation

## 2021-01-15 MED ORDER — CEPHALEXIN 500 MG PO CAPS: 1000.0000 mg | ORAL_CAPSULE | Freq: Two times a day (BID) | ORAL | 0 refills | Status: DC

## 2021-01-15 MED ORDER — IBUPROFEN 600 MG PO TABS: 600.0000 mg | ORAL_TABLET | Freq: Three times a day (TID) | ORAL | 0 refills | Status: DC | PRN

## 2021-01-15 MED ORDER — METHOCARBAMOL 750 MG PO TABS: 750.0000 mg | ORAL_TABLET | Freq: Three times a day (TID) | ORAL | 0 refills | Status: DC | PRN

## 2021-01-15 NOTE — ED Triage Notes (Signed)
Pt presents to ED with complaints of left sided flank pain and burning when she urinates x a few weeks.  Pt also c/o left upper and left lower dental pain x couple months.
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
## 2021-01-15 NOTE — Discharge Instructions (Addendum)
It was our pleasure to provide your ER care today - we hope that you feel better.  Take keflex (antibiotic) as prescribed.   Take ibuprofen as need for pain. You may also take robaxin as need for muscle pain/spasm - no driving when taking.   For dental issues, follow up with dentist in the coming week - call office Monday to arrange follow up appointment.   For hip issues/pain, follow up with orthopedic doctor in the next few weeks.   Return to ER if worse, new symptoms, fevers, severe or intractable pain, or other concern.

## 2021-04-03 ENCOUNTER — Other Ambulatory Visit: Payer: Self-pay

## 2021-04-03 ENCOUNTER — Emergency Department (HOSPITAL_COMMUNITY)
Admission: EM | Admit: 2021-04-03 | Discharge: 2021-04-03 | Disposition: A | Discharge status: Home / Self Care | Payer: BC Managed Care – PPO | Attending: Emergency Medicine | Admitting: Emergency Medicine

## 2021-04-03 ENCOUNTER — Emergency Department (HOSPITAL_COMMUNITY): Payer: BC Managed Care – PPO

## 2021-04-03 ENCOUNTER — Encounter (HOSPITAL_COMMUNITY): Payer: Self-pay

## 2021-04-03 DIAGNOSIS — R1011 Right upper quadrant pain: Secondary | ICD-10-CM | POA: Diagnosis not present

## 2021-04-03 DIAGNOSIS — Z20822 Contact with and (suspected) exposure to covid-19: Secondary | ICD-10-CM | POA: Insufficient documentation

## 2021-04-03 DIAGNOSIS — Z87891 Personal history of nicotine dependence: Secondary | ICD-10-CM | POA: Diagnosis not present

## 2021-04-03 DIAGNOSIS — K8020 Calculus of gallbladder without cholecystitis without obstruction: Secondary | ICD-10-CM

## 2021-04-03 DIAGNOSIS — R112 Nausea with vomiting, unspecified: Secondary | ICD-10-CM | POA: Insufficient documentation

## 2021-04-03 DIAGNOSIS — R197 Diarrhea, unspecified: Secondary | ICD-10-CM | POA: Insufficient documentation

## 2021-04-03 LAB — COMPREHENSIVE METABOLIC PANEL
ALT: 14 U/L (ref 0–44)
AST: 14 U/L — ABNORMAL LOW (ref 15–41)
Albumin: 4.2 g/dL (ref 3.5–5.0)
Alkaline Phosphatase: 53 U/L (ref 38–126)
Anion gap: 9 (ref 5–15)
BUN: 7 mg/dL (ref 6–20)
CO2: 28 mmol/L (ref 22–32)
Calcium: 9.5 mg/dL (ref 8.9–10.3)
Chloride: 101 mmol/L (ref 98–111)
Creatinine, Ser: 0.91 mg/dL (ref 0.44–1.00)
GFR, Estimated: >60 mL/min (ref >60)
Glucose, Bld: 94 mg/dL (ref 70–99)
Potassium: 3.6 mmol/L (ref 3.5–5.1)
Sodium: 138 mmol/L (ref 135–145)
Total Bilirubin: 0.7 mg/dL (ref 0.3–1.2)
Total Protein: 7.4 g/dL (ref 6.5–8.1)

## 2021-04-03 LAB — I-STAT BETA HCG BLOOD, ED (MC, WL, AP ONLY): I-stat hCG, quantitative: <5 m[IU]/mL (ref <5)

## 2021-04-03 LAB — CBC WITH DIFFERENTIAL/PLATELET
Basophils Absolute: 0 10*3/uL (ref 0.0–0.1)
Basophils Relative: 0 %
Eosinophils Absolute: 0 10*3/uL (ref 0.0–0.5)
Eosinophils Relative: 0 %
HCT: 42.5 % (ref 36.0–46.0)
Hemoglobin: 13.8 g/dL (ref 12.0–15.0)
Lymphocytes Relative: 26 %
Lymphs Abs: 2.6 10*3/uL (ref 0.7–4.0)
MCH: 31.3 pg (ref 26.0–34.0)
MCHC: 32.5 g/dL (ref 30.0–36.0)
MCV: 96.4 fL (ref 80.0–100.0)
Monocytes Absolute: 0.4 10*3/uL (ref 0.1–1.0)
Monocytes Relative: 4 %
Neutro Abs: 6.9 10*3/uL (ref 1.7–7.7)
Neutrophils Relative %: 70 %
Platelets: 244 10*3/uL (ref 150–400)
RBC: 4.41 MIL/uL (ref 3.87–5.11)
RDW: 13.2 % (ref 11.5–15.5)
WBC: 9.9 10*3/uL (ref 4.0–10.5)
nRBC: 0 % (ref 0.0–0.2)

## 2021-04-03 LAB — RESP PANEL BY RT-PCR (FLU A&B, COVID) ARPGX2
Influenza A by PCR: NEGATIVE
Influenza B by PCR: NEGATIVE
SARS Coronavirus 2 by RT PCR: NEGATIVE

## 2021-04-03 LAB — LIPASE, BLOOD: Lipase: 29 U/L (ref 11–51)

## 2021-04-03 MED ORDER — HYDROCODONE-ACETAMINOPHEN 5-325 MG PO TABS: 1.0000 | ORAL_TABLET | Freq: Three times a day (TID) | ORAL | 0 refills | Status: DC | PRN

## 2021-04-03 MED ORDER — MORPHINE SULFATE (PF) 4 MG/ML IV SOLN
4.0000 mg | Freq: Once | INTRAVENOUS | Status: AC
  Administered 2021-04-03: 4 mg via INTRAVENOUS
  Filled 2021-04-03: qty 1

## 2021-04-03 MED ORDER — ONDANSETRON 4 MG PO TBDP
4.0000 mg | ORAL_TABLET | Freq: Once | ORAL | Status: AC
  Administered 2021-04-03: 4 mg via ORAL
  Filled 2021-04-03: qty 1

## 2021-04-03 MED ORDER — SODIUM CHLORIDE 0.9 % IV BOLUS
1000.0000 mL | Freq: Once | INTRAVENOUS | Status: AC
  Administered 2021-04-03: 1000 mL via INTRAVENOUS

## 2021-04-03 MED ORDER — ONDANSETRON HCL 4 MG/2ML IJ SOLN
4.0000 mg | Freq: Once | INTRAMUSCULAR | Status: AC
  Administered 2021-04-03: 4 mg via INTRAVENOUS
  Filled 2021-04-03: qty 2

## 2021-04-03 MED ORDER — ONDANSETRON 8 MG PO TBDP: 8.0000 mg | ORAL_TABLET | Freq: Three times a day (TID) | ORAL | 0 refills | Status: DC | PRN

## 2021-04-03 NOTE — ED Triage Notes (Signed)
Patient reports N/V and abdominal pain that started this past Friday. States that she did not have period in May. Reports poor PO intake.

## 2021-04-03 NOTE — Discharge Instructions (Signed)
Please see Dr. Arnoldo Morale tomorrow at 1:30 in his clinic.

## 2021-04-03 NOTE — ED Provider Notes (Addendum)
[facility] EMERGENCY DEPARTMENT Provider Note   CSN: [id number] Arrival date & time: 04/03/21  1660     History Chief Complaint  Patient presents with  . Abdominal Pain    Marie Barnes is a 45 y.o. female.  HPI     45 year old female comes in a chief complaint of abdominal pain. She reports having upper quadrant abdominal pain that has been severe since Friday.  The pain is sharp, nonradiating.  She has associated nausea, vomiting and diarrhea.  Emesis and before yesterday, she thinks there was some water and bile mixed with it.  No blood in her stools or in the emesis.  Patient has no abdominal surgical history.  She also reports having COVID-19 exposure at work, but has no URI-like symptoms.  Past Medical History:  Diagnosis Date  . Closed posterior dislocation of left hip ([place]) 07/03/2019  . Marijuana abuse 07/22/2019  . Nicotine dependence 07/03/2019  . Vitamin D deficiency 07/22/2019    Patient Active Problem List   Diagnosis Date Noted  . Cocaine use 07/22/2019  . Vitamin D deficiency 07/22/2019  . Marijuana abuse 07/22/2019  . Alcohol intoxication ([place]) 07/22/2019  . Closed posterior dislocation of left hip ([place]) 07/03/2019  . Nicotine dependence 07/03/2019  . Fracture of femoral head ([place]) 07/02/2019    Past Surgical History:  Procedure Laterality Date  . TUBAL LIGATION       OB History    Gravida      Para      Term      Preterm      AB      Living  3     SAB      IAB      Ectopic      Multiple      Live Births              Family History  Problem Relation Age of Onset  . Diabetes Other     Social History   Tobacco Use  . Smoking status: Former Smoker    Packs/day: 1.00    Types: Cigarettes  . Smokeless tobacco: Never Used  Vaping Use  . Vaping Use: Never used  Substance Use Topics  . Alcohol use: Yes    Comment: occasional social drinker  . Drug use: Yes    Types: Marijuana    Comment: denies    Home  Medications Prior to Admission medications   Medication Sig Start Date End Date Taking? Authorizing Provider  HYDROcodone-acetaminophen (NORCO/VICODIN) 5-325 MG tablet Take 1 tablet by mouth every 8 (eight) hours as needed for up to 3 days for severe pain. 04/03/21 04/06/21 Yes Tyrelle Raczka, MD  ondansetron (ZOFRAN ODT) 8 MG disintegrating tablet Take 1 tablet (8 mg total) by mouth every 8 (eight) hours as needed for nausea. 04/03/21  Yes Varney Biles, MD  cephALEXin (KEFLEX) 500 MG capsule Take 2 capsules (1,000 mg total) by mouth 2 (two) times daily. Patient not taking: Reported on 04/03/2021 01/15/21   Lajean Saver, MD  ibuprofen (ADVIL) 600 MG tablet Take 1 tablet (600 mg total) by mouth every 8 (eight) hours as needed. Take with food. Patient not taking: Reported on 04/03/2021 01/15/21   Lajean Saver, MD  methocarbamol (ROBAXIN) 750 MG tablet Take 1 tablet (750 mg total) by mouth 3 (three) times daily as needed (muscle spasm/pain). Patient not taking: Reported on 04/03/2021 01/15/21   Lajean Saver, MD    Allergies    Asa [aspirin] and Penicillins  Review of Systems   Review of Systems  Constitutional: Positive for activity change.  Respiratory: Negative for shortness of breath.   Cardiovascular: Negative for chest pain.  Gastrointestinal: Positive for abdominal pain, nausea and vomiting.  Neurological: Positive for dizziness.  Hematological: Does not bruise/bleed easily.  All other systems reviewed and are negative.   Physical Exam Updated Vital Signs BP (!) 141/77   Pulse (!) 56   Temp 97.7 F (36.5 C) (Oral)   Resp (!) 21   Ht 5\' 6"  (1.676 m)   Wt 90.7 kg   SpO2 100%   BMI 32.28 kg/m   Physical Exam Vitals and nursing note reviewed.  Constitutional:      Appearance: She is well-developed.  HENT:     Head: Normocephalic and atraumatic.  Cardiovascular:     Rate and Rhythm: Normal rate.  Pulmonary:     Effort: Pulmonary effort is normal.  Abdominal:     General:  Bowel sounds are normal.     Tenderness: There is abdominal tenderness in the right upper quadrant and epigastric area. There is guarding. There is no rebound. Negative signs include Murphy's sign.  Musculoskeletal:     Cervical back: Normal range of motion and neck supple.  Skin:    General: Skin is warm and dry.  Neurological:     Mental Status: She is alert and oriented to person, place, and time.     ED Results / Procedures / Treatments   Labs (all labs ordered are listed, but only abnormal results are displayed) Labs Reviewed  COMPREHENSIVE METABOLIC PANEL - Abnormal; Notable for the following components:      Result Value   AST 14 (*)    All other components within normal limits  RESP PANEL BY RT-PCR (FLU A&B, COVID) ARPGX2  CBC WITH DIFFERENTIAL/PLATELET  LIPASE, BLOOD  URINALYSIS, ROUTINE W REFLEX MICROSCOPIC  I-STAT BETA HCG BLOOD, ED ([facility], [facility], [facility] ONLY)    EKG None  Radiology US Abdomen Limited RUQ (LIVER/GB)  Result Date: 04/03/2021 CLINICAL DATA:  Acute right upper quadrant abdominal pain. EXAM: ULTRASOUND ABDOMEN LIMITED RIGHT UPPER QUADRANT COMPARISON:  November 10, 2019. FINDINGS: Gallbladder: 1 cm gallstone is noted. No gallbladder wall thickening or pericholecystic fluid is noted. No sonographic Murphy's sign is noted. Common bile duct: Diameter: 3 mm which is within normal limits. Liver: No focal lesion identified. Within normal limits in parenchymal echogenicity. Portal vein is patent on color Doppler imaging with normal direction of blood flow towards the liver. Other: None. IMPRESSION: Cholelithiasis without evidence of cholecystitis. No other abnormality seen in the right upper quadrant of the abdomen. Electronically Signed   By: Marijo Conception M.D.   On: 04/03/2021 09:17    Procedures Procedures   Medications Ordered in ED Medications  sodium chloride 0.9 % bolus 1,000 mL (0 mLs Intravenous Stopped 04/03/21 0931)  morphine 4 MG/ML injection 4 mg (4 mg  Intravenous Given 04/03/21 0827)  ondansetron (ZOFRAN) injection 4 mg (4 mg Intravenous Given 04/03/21 0827)  ondansetron (ZOFRAN-ODT) disintegrating tablet 4 mg (4 mg Oral Given 04/03/21 0937)  morphine 4 MG/ML injection 4 mg (4 mg Intravenous Given 04/03/21 1015)    ED Course  I have reviewed the triage vital signs and the nursing notes.  Pertinent labs & imaging results that were available during my care of the patient were reviewed by me and considered in my medical decision making (see chart for details).  Clinical Course as of 04/03/21 1128  Mon Apr 03, 2021  0931 US Abdomen Limited RUQ (LIVER/GB) Gallstone without cholecystitis.  P.o. challenge initiated. [AN]  [id number] Comprehensive metabolic panel(!) CBC and CMP are reassuring. [AN]    Clinical Course User Index [AN] Varney Biles, MD   MDM Rules/Calculators/A&P                          DDx includes: Pancreatitis Hepatobiliary pathology including cholecystitis Gastritis/PUD SBO Gastroenteritis COVID-56  45 year old female comes in a chief complaint of abdominal pain, nausea, vomiting and loose BM.  Will initiate basic blood work-up along with ultrasound right upper quadrant.    11:28 AM Patient is agreement with the plan of seeing Dr. Arnoldo Morale tomorrow in the clinic.  Dr. Arnoldo Morale has asked with patient to come to his clinic at 1:30 PM return precautions discussed.  Final Clinical Impression(s) / ED Diagnoses Final diagnoses:  Abdominal pain, RUQ  Calculus of gallbladder without cholecystitis without obstruction    Rx / DC Orders ED Discharge Orders         Ordered    HYDROcodone-acetaminophen (NORCO/VICODIN) 5-325 MG tablet  Every 8 hours PRN        04/03/21 1054    ondansetron (ZOFRAN ODT) 8 MG disintegrating tablet  Every 8 hours PRN        04/03/21 1054           Varney Biles, MD 04/03/21 4327    Varney Biles, MD 04/03/21 1128

## 2021-04-04 ENCOUNTER — Encounter: Payer: Self-pay | Admitting: General Surgery

## 2021-04-04 ENCOUNTER — Ambulatory Visit (INDEPENDENT_AMBULATORY_CARE_PROVIDER_SITE_OTHER): Payer: Self-pay | Admitting: General Surgery

## 2021-04-04 VITALS — BP 114/81 | HR 76 | Temp 98.4°F | Resp 14 | Ht 66.0 in | Wt 201.0 lb

## 2021-04-04 DIAGNOSIS — K8020 Calculus of gallbladder without cholecystitis without obstruction: Secondary | ICD-10-CM

## 2021-04-04 MED ORDER — PROMETHAZINE HCL 12.5 MG PO TABS: 12.5000 mg | ORAL_TABLET | Freq: Four times a day (QID) | ORAL | 1 refills | Status: AC | PRN

## 2021-04-04 MED ORDER — HYDROCODONE-ACETAMINOPHEN 5-325 MG PO TABS: 1.0000 | ORAL_TABLET | ORAL | 0 refills | Status: DC | PRN

## 2021-04-04 NOTE — Patient Instructions (Signed)
Minimally Invasive Cholecystectomy Minimally invasive cholecystectomy is surgery to remove the gallbladder. The gallbladder is a pear-shaped organ that lies beneath the liver on the right side of the body. The gallbladder stores bile, which is a fluid that helps the body digest fats. Cholecystectomy is often done to treat inflammation of the gallbladder (cholecystitis). This condition is usually caused by a buildup of gallstones (cholelithiasis) in the gallbladder. Gallstones can block the flow of bile, which can result in inflammation and pain. In severe cases, emergency surgery may be required. This procedure is done though small incisions in the abdomen, instead of one large incision. It is also called laparoscopic surgery. A thin scope with a camera (laparoscope) is inserted through one incision. Then surgical instruments are inserted through the other incisions. In some cases, a minimally invasive surgery may need to be changed to a surgery that is done through a larger incision. This is called open surgery. Tell a health care provider about:  Any allergies you have.  All medicines you are taking, including vitamins, herbs, eye drops, creams, and over-the-counter medicines.  Any problems you or family members have had with anesthetic medicines.  Any blood disorders you have.  Any surgeries you have had.  Any medical conditions you have.  Whether you are pregnant or may be pregnant. What are the risks? Generally, this is a safe procedure. However, problems may occur, including:  Infection.  Bleeding.  Allergic reactions to medicines.  Damage to nearby structures or organs.  A stone remaining in the common bile duct. The common bile duct carries bile from the gallbladder into the small intestine.  A bile leak from the cyst duct that is clipped when your gallbladder is removed. What happens before the procedure? Medicines Ask your health care provider about:  Changing or  stopping your regular medicines. This is especially important if you are taking diabetes medicines or blood thinners.  Taking medicines such as aspirin and ibuprofen. These medicines can thin your blood. Do not take these medicines unless your health care provider tells you to take them.  Taking over-the-counter medicines, vitamins, herbs, and supplements. General instructions  Let your health care provider know if you develop a cold or an infection before surgery.  Plan to have someone take you home from the hospital or clinic.  If you will be going home right after the procedure, plan to have someone with you for 24 hours.  Ask your health care provider: ? How your surgery site will be marked. ? What steps will be taken to help prevent infection. These may include:  Removing hair at the surgery site.  Washing skin with a germ-killing soap.  Taking antibiotic medicine. What happens during the procedure?  An IV will be inserted into one of your veins.  You will be given one or both of the following: ? A medicine to help you relax (sedative). ? A medicine to make you fall asleep (general anesthetic).  A breathing tube will be placed in your mouth.  Your surgeon will make several small incisions in your abdomen.  The laparoscope will be inserted through one of the small incisions. The camera on the laparoscope will send images to a monitor in the operating room. This lets your surgeon see inside your abdomen.  A gas will be pumped into your abdomen. This will expand your abdomen to give the surgeon more room to perform the surgery.  Other tools that are needed for the procedure will be inserted through the   other incisions. The gallbladder will be removed through one of the incisions.  Your common bile duct may be examined. If stones are found in the common bile duct, they may be removed.  After your gallbladder has been removed, the incisions will be closed with stitches  (sutures), staples, or skin glue.  Your incisions may be covered with a bandage (dressing). The procedure may vary among health care providers and hospitals.   What happens after the procedure?  Your blood pressure, heart rate, breathing rate, and blood oxygen level will be monitored until you leave the hospital or clinic.  You will be given medicines as needed to control your pain.  If you were given a sedative during the procedure, it can affect you for several hours. Do not drive or operate machinery until your health care provider says that it is safe. Summary  Minimally invasive cholecystectomy, also called laparoscopic cholecystectomy, is surgery to remove the gallbladder using small incisions.  Tell your health care provider about all the medical conditions you have and all the medicines you are taking for those conditions.  Before the procedure, follow instructions about eating or drinking restrictions and changing or stopping medicines.  If you were given a sedative during the procedure, it can affect you for several hours. Do not drive or operate machinery until your health care provider says that it is safe. This information is not intended to replace advice given to you by your health care provider. Make sure you discuss any questions you have with your health care provider. Document Revised: 07/20/2019 Document Reviewed: 07/20/2019 Elsevier Patient Education  2021 [employer]  

## 2021-04-04 NOTE — Progress Notes (Signed)
Marie Barnes; [id number]; Aug 24, 1976   HPI Patient is a 45 year old white female who was referred to my care by the emergency room for right upper quadrant abdominal pain secondary to cholelithiasis.  She states this was her first episode.  She presented to the emergency room with nausea and right upper quadrant abdominal pain.  Ultrasound the gallbladder revealed cholelithiasis.  Normal common bile duct was found.  Her white blood cell count as well as liver enzyme tests were all within normal limits.  She was discharged home on hydrocodone and sees me in the office today.  She states her symptoms have persisted.  She is also still nauseated.  She cannot afford the Zofran prescription.  She denies any fever, chills, or jaundice.  Her appetite is decreased. Past Medical History:  Diagnosis Date  . Closed posterior dislocation of left hip ([place]) 07/03/2019  . Marijuana abuse 07/22/2019  . Nicotine dependence 07/03/2019  . Vitamin D deficiency 07/22/2019    Past Surgical History:  Procedure Laterality Date  . TUBAL LIGATION      Family History  Problem Relation Age of Onset  . Diabetes Other     Current Outpatient Medications on File Prior to Visit  Medication Sig Dispense Refill  . ondansetron (ZOFRAN ODT) 8 MG disintegrating tablet Take 1 tablet (8 mg total) by mouth every 8 (eight) hours as needed for nausea. (Patient not taking: Reported on 04/04/2021) 20 tablet 0   No current facility-administered medications on file prior to visit.    Allergies  Allergen Reactions  . Asa [Aspirin] Shortness Of Breath and Nausea And Vomiting    GI upset  . Penicillins Nausea And Vomiting    Did it involve swelling of the face/tongue/throat, SOB, or low BP? No Did it involve sudden or severe rash/hives, skin peeling, or any reaction on the inside of your mouth or nose? No Did you need to seek medical attention at a hospital or doctor's office? No When did it last happen?Childhood If all above  answers are "NO", may proceed with cephalosporin use.    Social History   Substance and Sexual Activity  Alcohol Use Yes   Comment: occasional social drinker    Social History   Tobacco Use  Smoking Status Former Smoker  . Packs/day: 1.00  . Types: Cigarettes  Smokeless Tobacco Never Used    Review of Systems  Constitutional: Positive for malaise/fatigue.  HENT: Positive for ear pain.   Eyes: Positive for pain.  Respiratory: Negative.   Cardiovascular: Negative.   Gastrointestinal: Positive for abdominal pain, heartburn, nausea and vomiting.  Genitourinary: Positive for frequency.  Musculoskeletal: Positive for back pain and joint pain.  Skin: Negative.   Neurological: Negative.   Endo/Heme/Allergies: Negative.   Psychiatric/Behavioral: Negative.     Objective   Vitals:   04/04/21 1319  BP: 114/81  Pulse: 76  Resp: 14  Temp: 98.4 F (36.9 C)  SpO2: 98%    Physical Exam Vitals reviewed.  Constitutional:      Appearance: Normal appearance. She is not ill-appearing.  HENT:     Head: Normocephalic and atraumatic.  Eyes:     General: No scleral icterus. Cardiovascular:     Rate and Rhythm: Normal rate and regular rhythm.     Heart sounds: Normal heart sounds. No murmur heard. No friction rub. No gallop.   Pulmonary:     Effort: Pulmonary effort is normal. No respiratory distress.     Breath sounds: Normal breath sounds. No stridor. No wheezing,  rhonchi or rales.  Abdominal:     General: Bowel sounds are normal. There is no distension.     Palpations: Abdomen is soft. There is no mass.     Tenderness: There is abdominal tenderness. There is no guarding or rebound.     Hernia: No hernia is present.     Comments: Tender on the right upper quadrant to palpation.  Skin:    General: Skin is warm and dry.  Neurological:     Mental Status: She is alert and oriented to person, place, and time.   ER notes reviewed  Assessment  Biliary colic,  cholelithiasis Plan   Patient is scheduled for laparoscopic cholecystectomy on 04/07/2021.  The risks and benefits of the procedure including bleeding, infection, hepatobiliary injury, the possibility of an open procedure were fully explained to the patient, who gave informed consent.  Norco and Phenergan have been prescribed.

## 2021-04-04 NOTE — H&P (Signed)
Marie Barnes; [id number]; 1976-10-06   HPI Patient is a 45 year old white female who was referred to my care by the emergency room for right upper quadrant abdominal pain secondary to cholelithiasis.  She states this was her first episode.  She presented to the emergency room with nausea and right upper quadrant abdominal pain.  Ultrasound the gallbladder revealed cholelithiasis.  Normal common bile duct was found.  Her white blood cell count as well as liver enzyme tests were all within normal limits.  She was discharged home on hydrocodone and sees me in the office today.  She states her symptoms have persisted.  She is also still nauseated.  She cannot afford the Zofran prescription.  She denies any fever, chills, or jaundice.  Her appetite is decreased. Past Medical History:  Diagnosis Date  . Closed posterior dislocation of left hip ([place]) 07/03/2019  . Marijuana abuse 07/22/2019  . Nicotine dependence 07/03/2019  . Vitamin D deficiency 07/22/2019    Past Surgical History:  Procedure Laterality Date  . TUBAL LIGATION      Family History  Problem Relation Age of Onset  . Diabetes Other     Current Outpatient Medications on File Prior to Visit  Medication Sig Dispense Refill  . ondansetron (ZOFRAN ODT) 8 MG disintegrating tablet Take 1 tablet (8 mg total) by mouth every 8 (eight) hours as needed for nausea. (Patient not taking: Reported on 04/04/2021) 20 tablet 0   No current facility-administered medications on file prior to visit.    Allergies  Allergen Reactions  . Asa [Aspirin] Shortness Of Breath and Nausea And Vomiting    GI upset  . Penicillins Nausea And Vomiting    Did it involve swelling of the face/tongue/throat, SOB, or low BP? No Did it involve sudden or severe rash/hives, skin peeling, or any reaction on the inside of your mouth or nose? No Did you need to seek medical attention at a hospital or doctor's office? No When did it last happen?Childhood If all above  answers are "NO", may proceed with cephalosporin use.    Social History   Substance and Sexual Activity  Alcohol Use Yes   Comment: occasional social drinker    Social History   Tobacco Use  Smoking Status Former Smoker  . Packs/day: 1.00  . Types: Cigarettes  Smokeless Tobacco Never Used    Review of Systems  Constitutional: Positive for malaise/fatigue.  HENT: Positive for ear pain.   Eyes: Positive for pain.  Respiratory: Negative.   Cardiovascular: Negative.   Gastrointestinal: Positive for abdominal pain, heartburn, nausea and vomiting.  Genitourinary: Positive for frequency.  Musculoskeletal: Positive for back pain and joint pain.  Skin: Negative.   Neurological: Negative.   Endo/Heme/Allergies: Negative.   Psychiatric/Behavioral: Negative.     Objective   Vitals:   04/04/21 1319  BP: 114/81  Pulse: 76  Resp: 14  Temp: 98.4 F (36.9 C)  SpO2: 98%    Physical Exam Vitals reviewed.  Constitutional:      Appearance: Normal appearance. She is not ill-appearing.  HENT:     Head: Normocephalic and atraumatic.  Eyes:     General: No scleral icterus. Cardiovascular:     Rate and Rhythm: Normal rate and regular rhythm.     Heart sounds: Normal heart sounds. No murmur heard. No friction rub. No gallop.   Pulmonary:     Effort: Pulmonary effort is normal. No respiratory distress.     Breath sounds: Normal breath sounds. No stridor. No wheezing,  rhonchi or rales.  Abdominal:     General: Bowel sounds are normal. There is no distension.     Palpations: Abdomen is soft. There is no mass.     Tenderness: There is abdominal tenderness. There is no guarding or rebound.     Hernia: No hernia is present.     Comments: Tender on the right upper quadrant to palpation.  Skin:    General: Skin is warm and dry.  Neurological:     Mental Status: She is alert and oriented to person, place, and time.   ER notes reviewed  Assessment  Biliary colic,  cholelithiasis Plan   Patient is scheduled for laparoscopic cholecystectomy on 04/07/2021.  The risks and benefits of the procedure including bleeding, infection, hepatobiliary injury, the possibility of an open procedure were fully explained to the patient, who gave informed consent.

## 2021-04-05 ENCOUNTER — Encounter (HOSPITAL_COMMUNITY)
Admission: RE | Admit: 2021-04-05 | Discharge: 2021-04-05 | Disposition: A | Discharge status: Home / Self Care | Payer: Self-pay | Source: Ambulatory Visit | Attending: General Surgery | Admitting: General Surgery

## 2021-04-05 ENCOUNTER — Encounter (HOSPITAL_COMMUNITY): Payer: Self-pay

## 2021-04-05 ENCOUNTER — Other Ambulatory Visit: Payer: Self-pay

## 2021-04-07 ENCOUNTER — Encounter (HOSPITAL_COMMUNITY)
Admission: RE | Disposition: A | Discharge status: Home / Self Care | Payer: Self-pay | Source: Home / Self Care | Attending: General Surgery

## 2021-04-07 ENCOUNTER — Ambulatory Visit (HOSPITAL_COMMUNITY): Payer: BC Managed Care – PPO | Admitting: Anesthesiology

## 2021-04-07 ENCOUNTER — Ambulatory Visit (HOSPITAL_COMMUNITY)
Admission: RE | Admit: 2021-04-07 | Discharge: 2021-04-07 | Disposition: A | Discharge status: Home / Self Care | Payer: BC Managed Care – PPO | Attending: General Surgery | Admitting: General Surgery

## 2021-04-07 ENCOUNTER — Encounter (HOSPITAL_COMMUNITY): Payer: Self-pay | Admitting: General Surgery

## 2021-04-07 DIAGNOSIS — Z880 Allergy status to penicillin: Secondary | ICD-10-CM | POA: Diagnosis not present

## 2021-04-07 DIAGNOSIS — Z886 Allergy status to analgesic agent status: Secondary | ICD-10-CM | POA: Diagnosis not present

## 2021-04-07 DIAGNOSIS — Z87891 Personal history of nicotine dependence: Secondary | ICD-10-CM | POA: Diagnosis not present

## 2021-04-07 DIAGNOSIS — K8020 Calculus of gallbladder without cholecystitis without obstruction: Secondary | ICD-10-CM | POA: Diagnosis present

## 2021-04-07 DIAGNOSIS — K8010 Calculus of gallbladder with chronic cholecystitis without obstruction: Secondary | ICD-10-CM | POA: Insufficient documentation

## 2021-04-07 HISTORY — PX: CHOLECYSTECTOMY: SHX55

## 2021-04-07 SURGERY — LAPAROSCOPIC CHOLECYSTECTOMY
Anesthesia: General | Site: Abdomen

## 2021-04-07 MED ORDER — BUPIVACAINE LIPOSOME 1.3 % IJ SUSP
INTRAMUSCULAR | Status: DC | PRN
  Administered 2021-04-07: 20 mL

## 2021-04-07 MED ORDER — CIPROFLOXACIN IN D5W 400 MG/200ML IV SOLN
400.0000 mg | INTRAVENOUS | Status: AC
  Administered 2021-04-07: 400 mg via INTRAVENOUS

## 2021-04-07 MED ORDER — DEXAMETHASONE SODIUM PHOSPHATE 4 MG/ML IJ SOLN
INTRAMUSCULAR | Status: DC | PRN
  Administered 2021-04-07: 4 mg via INTRAVENOUS

## 2021-04-07 MED ORDER — DEXAMETHASONE SODIUM PHOSPHATE 4 MG/ML IJ SOLN
4.0000 mg | Freq: Once | INTRAMUSCULAR | Status: AC
  Administered 2021-04-07: 4 mg via INTRAVENOUS

## 2021-04-07 MED ORDER — DEXMEDETOMIDINE HCL 200 MCG/2ML IV SOLN
INTRAVENOUS | Status: DC | PRN
  Administered 2021-04-07: 20 ug via INTRAVENOUS

## 2021-04-07 MED ORDER — ROCURONIUM BROMIDE 10 MG/ML (PF) SYRINGE
PREFILLED_SYRINGE | INTRAVENOUS | Status: DC | PRN
  Administered 2021-04-07: 40 mg via INTRAVENOUS

## 2021-04-07 MED ORDER — DIPHENHYDRAMINE HCL 50 MG/ML IJ SOLN
INTRAMUSCULAR | Status: AC
  Filled 2021-04-07: qty 1

## 2021-04-07 MED ORDER — DEXAMETHASONE SODIUM PHOSPHATE 10 MG/ML IJ SOLN
INTRAMUSCULAR | Status: AC
  Filled 2021-04-07: qty 1

## 2021-04-07 MED ORDER — HYDROMORPHONE HCL 1 MG/ML IJ SOLN
0.2500 mg | INTRAMUSCULAR | Status: DC | PRN
  Administered 2021-04-07 (×4): 0.5 mg via INTRAVENOUS
  Filled 2021-04-07 (×4): qty 0.5

## 2021-04-07 MED ORDER — FENTANYL CITRATE (PF) 100 MCG/2ML IJ SOLN
INTRAMUSCULAR | Status: DC | PRN
  Administered 2021-04-07: 50 ug via INTRAVENOUS
  Administered 2021-04-07: 150 ug via INTRAVENOUS
  Administered 2021-04-07: 50 ug via INTRAVENOUS

## 2021-04-07 MED ORDER — SODIUM CHLORIDE 0.9 % IR SOLN
Status: DC | PRN
  Administered 2021-04-07: 1000 mL

## 2021-04-07 MED ORDER — DIPHENHYDRAMINE HCL 50 MG/ML IJ SOLN
25.0000 mg | Freq: Once | INTRAMUSCULAR | Status: AC
  Administered 2021-04-07: 25 mg via INTRAVENOUS

## 2021-04-07 MED ORDER — DEXMEDETOMIDINE (PRECEDEX) IN NS 20 MCG/5ML (4 MCG/ML) IV SYRINGE
PREFILLED_SYRINGE | INTRAVENOUS | Status: AC
  Filled 2021-04-07: qty 5

## 2021-04-07 MED ORDER — LIDOCAINE HCL (PF) 2 % IJ SOLN
INTRAMUSCULAR | Status: AC
  Filled 2021-04-07: qty 5

## 2021-04-07 MED ORDER — LACTATED RINGERS IV SOLN
INTRAVENOUS | Status: DC
  Administered 2021-04-07: 1000 mL via INTRAVENOUS

## 2021-04-07 MED ORDER — CHLORHEXIDINE GLUCONATE CLOTH 2 % EX PADS: 6.0000 | MEDICATED_PAD | Freq: Once | CUTANEOUS | Status: DC

## 2021-04-07 MED ORDER — HEMOSTATIC AGENTS (NO CHARGE) OPTIME
TOPICAL | Status: DC | PRN
  Administered 2021-04-07: 1 via TOPICAL

## 2021-04-07 MED ORDER — LIDOCAINE HCL (CARDIAC) PF 100 MG/5ML IV SOSY
PREFILLED_SYRINGE | INTRAVENOUS | Status: DC | PRN
  Administered 2021-04-07: 100 mg via INTRAVENOUS

## 2021-04-07 MED ORDER — SUGAMMADEX SODIUM 200 MG/2ML IV SOLN
INTRAVENOUS | Status: DC | PRN
  Administered 2021-04-07: 200 mg via INTRAVENOUS

## 2021-04-07 MED ORDER — ONDANSETRON HCL 4 MG/2ML IJ SOLN
INTRAMUSCULAR | Status: AC
  Filled 2021-04-07: qty 2

## 2021-04-07 MED ORDER — MIDAZOLAM HCL 5 MG/5ML IJ SOLN
INTRAMUSCULAR | Status: DC | PRN
  Administered 2021-04-07: 2 mg via INTRAVENOUS

## 2021-04-07 MED ORDER — ONDANSETRON HCL 4 MG/2ML IJ SOLN: 4.0000 mg | Freq: Once | INTRAMUSCULAR | Status: DC

## 2021-04-07 MED ORDER — FENTANYL CITRATE (PF) 250 MCG/5ML IJ SOLN
INTRAMUSCULAR | Status: AC
  Filled 2021-04-07: qty 5

## 2021-04-07 MED ORDER — ONDANSETRON HCL 4 MG/2ML IJ SOLN
4.0000 mg | Freq: Once | INTRAMUSCULAR | Status: DC | PRN
Start: 2021-04-07 — End: 2021-04-07

## 2021-04-07 MED ORDER — ORAL CARE MOUTH RINSE: 15.0000 mL | Freq: Once | OROMUCOSAL | Status: AC

## 2021-04-07 MED ORDER — BUPIVACAINE LIPOSOME 1.3 % IJ SUSP
INTRAMUSCULAR | Status: AC
  Filled 2021-04-07: qty 20

## 2021-04-07 MED ORDER — PROPOFOL 10 MG/ML IV BOLUS
INTRAVENOUS | Status: AC
  Filled 2021-04-07: qty 20

## 2021-04-07 MED ORDER — ONDANSETRON HCL 4 MG/2ML IJ SOLN
INTRAMUSCULAR | Status: AC
  Administered 2021-04-07: 4 mg
  Filled 2021-04-07: qty 2

## 2021-04-07 MED ORDER — ONDANSETRON HCL 4 MG/2ML IJ SOLN
INTRAMUSCULAR | Status: DC | PRN
  Administered 2021-04-07: 4 mg via INTRAVENOUS

## 2021-04-07 MED ORDER — SCOPOLAMINE 1 MG/3DAYS TD PT72
1.0000 | MEDICATED_PATCH | Freq: Once | TRANSDERMAL | Status: DC
  Administered 2021-04-07: 1.5 mg via TRANSDERMAL
  Filled 2021-04-07: qty 1

## 2021-04-07 MED ORDER — MEPERIDINE HCL 50 MG/ML IJ SOLN: 6.2500 mg | INTRAMUSCULAR | Status: DC | PRN

## 2021-04-07 MED ORDER — DEXAMETHASONE SODIUM PHOSPHATE 4 MG/ML IJ SOLN
INTRAMUSCULAR | Status: AC
  Filled 2021-04-07: qty 1

## 2021-04-07 MED ORDER — KETOROLAC TROMETHAMINE 30 MG/ML IJ SOLN
INTRAMUSCULAR | Status: AC
  Filled 2021-04-07: qty 1

## 2021-04-07 MED ORDER — ROCURONIUM BROMIDE 10 MG/ML (PF) SYRINGE
PREFILLED_SYRINGE | INTRAVENOUS | Status: AC
  Filled 2021-04-07: qty 10

## 2021-04-07 MED ORDER — PROPOFOL 10 MG/ML IV BOLUS
INTRAVENOUS | Status: DC | PRN
  Administered 2021-04-07: 200 mg via INTRAVENOUS

## 2021-04-07 MED ORDER — SUCCINYLCHOLINE CHLORIDE 200 MG/10ML IV SOSY
PREFILLED_SYRINGE | INTRAVENOUS | Status: AC
  Filled 2021-04-07: qty 10

## 2021-04-07 MED ORDER — PROPOFOL 1000 MG/100ML IV EMUL
INTRAVENOUS | Status: AC
  Filled 2021-04-07: qty 100

## 2021-04-07 MED ORDER — KETOROLAC TROMETHAMINE 30 MG/ML IJ SOLN
30.0000 mg | Freq: Once | INTRAMUSCULAR | Status: AC
  Administered 2021-04-07: 30 mg via INTRAVENOUS

## 2021-04-07 MED ORDER — SUCCINYLCHOLINE CHLORIDE 20 MG/ML IJ SOLN
INTRAMUSCULAR | Status: DC | PRN
  Administered 2021-04-07: 140 mg via INTRAVENOUS

## 2021-04-07 MED ORDER — HYDROCODONE-ACETAMINOPHEN 5-325 MG PO TABS: 1.0000 | ORAL_TABLET | ORAL | 0 refills | Status: DC | PRN

## 2021-04-07 MED ORDER — CHLORHEXIDINE GLUCONATE 0.12 % MT SOLN
15.0000 mL | Freq: Once | OROMUCOSAL | Status: AC
  Administered 2021-04-07: 15 mL via OROMUCOSAL

## 2021-04-07 MED ORDER — CIPROFLOXACIN IN D5W 400 MG/200ML IV SOLN
INTRAVENOUS | Status: AC
  Filled 2021-04-07: qty 200

## 2021-04-07 MED ORDER — MIDAZOLAM HCL 2 MG/2ML IJ SOLN
INTRAMUSCULAR | Status: AC
  Filled 2021-04-07: qty 2

## 2021-04-07 SURGICAL SUPPLY — 48 items
ADH SKN CLS APL DERMABOND .7 (GAUZE/BANDAGES/DRESSINGS) ×1
APL PRP STRL LF DISP 70% ISPRP (MISCELLANEOUS) ×1
APL SRG 38 LTWT LNG FL B (MISCELLANEOUS)
APPLICATOR ARISTA FLEXITIP XL (MISCELLANEOUS) IMPLANT
APPLIER CLIP ROT 10 11.4 M/L (STAPLE) ×3
APR CLP MED LRG 11.4X10 (STAPLE) ×1
BAG RETRIEVAL 10 (BASKET) ×1
BAG RETRIEVAL 10MM (BASKET) ×1
CHLORAPREP W/TINT 26 (MISCELLANEOUS) ×3 IMPLANT
CLIP APPLIE ROT 10 11.4 M/L (STAPLE) ×1 IMPLANT
CLOTH BEACON ORANGE TIMEOUT ST (SAFETY) ×3 IMPLANT
COVER LIGHT HANDLE STERIS (MISCELLANEOUS) ×6 IMPLANT
COVER WAND RF STERILE (DRAPES) ×3 IMPLANT
DERMABOND ADVANCED (GAUZE/BANDAGES/DRESSINGS) ×2
DERMABOND ADVANCED .7 DNX12 (GAUZE/BANDAGES/DRESSINGS) ×1 IMPLANT
ELECT REM PT RETURN 9FT ADLT (ELECTROSURGICAL) ×3
ELECTRODE REM PT RTRN 9FT ADLT (ELECTROSURGICAL) ×1 IMPLANT
GLOVE SURG SS PI 7.5 STRL IVOR (GLOVE) ×3 IMPLANT
GLOVE SURG UNDER POLY LF SZ7 (GLOVE) ×6 IMPLANT
GOWN STRL REUS W/TWL LRG LVL3 (GOWN DISPOSABLE) ×9 IMPLANT
HEMOSTAT ARISTA ABSORB 3G PWDR (HEMOSTASIS) IMPLANT
HEMOSTAT SNOW SURGICEL 2X4 (HEMOSTASIS) ×3 IMPLANT
INST SET LAPROSCOPIC AP (KITS) ×3 IMPLANT
KIT TURNOVER KIT A (KITS) ×3 IMPLANT
MANIFOLD NEPTUNE II (INSTRUMENTS) ×3 IMPLANT
NDL HYPO 18GX1.5 BLUNT FILL (NEEDLE) ×1 IMPLANT
NDL HYPO 21X1.5 SAFETY (NEEDLE) ×1 IMPLANT
NDL INSUFFLATION 14GA 120MM (NEEDLE) ×1 IMPLANT
NEEDLE HYPO 18GX1.5 BLUNT FILL (NEEDLE) ×3 IMPLANT
NEEDLE HYPO 21X1.5 SAFETY (NEEDLE) ×3 IMPLANT
NEEDLE INSUFFLATION 14GA 120MM (NEEDLE) ×3 IMPLANT
NS IRRIG 1000ML POUR BTL (IV SOLUTION) ×3 IMPLANT
PACK LAP CHOLE LZT030E (CUSTOM PROCEDURE TRAY) ×3 IMPLANT
PAD ARMBOARD 7.5X6 YLW CONV (MISCELLANEOUS) ×3 IMPLANT
SET BASIN LINEN APH (SET/KITS/TRAYS/PACK) ×3 IMPLANT
SET TUBE SMOKE EVAC HIGH FLOW (TUBING) ×3 IMPLANT
SLEEVE ENDOPATH XCEL 5M (ENDOMECHANICALS) ×3 IMPLANT
SUT MNCRL AB 4-0 PS2 18 (SUTURE) ×6 IMPLANT
SUT VICRYL 0 UR6 27IN ABS (SUTURE) ×3 IMPLANT
SYR 20ML LL LF (SYRINGE) ×6 IMPLANT
SYS BAG RETRIEVAL 10MM (BASKET) ×1
SYSTEM BAG RETRIEVAL 10MM (BASKET) ×1 IMPLANT
TROCAR ENDO BLADELESS 11MM (ENDOMECHANICALS) ×3 IMPLANT
TROCAR XCEL NON-BLD 5MMX100MML (ENDOMECHANICALS) ×3 IMPLANT
TROCAR XCEL UNIV SLVE 11M 100M (ENDOMECHANICALS) ×3 IMPLANT
TUBE CONNECTING 12'X1/4 (SUCTIONS) ×1
TUBE CONNECTING 12X1/4 (SUCTIONS) ×2 IMPLANT
WARMER LAPAROSCOPE (MISCELLANEOUS) ×3 IMPLANT

## 2021-04-07 NOTE — Anesthesia Postprocedure Evaluation (Signed)
Anesthesia Post Note  Patient: Marie Barnes  Procedure(s) Performed: LAPAROSCOPIC CHOLECYSTECTOMY (Abdomen)  Patient location during evaluation: Phase II Anesthesia Type: General Level of consciousness: awake and alert and oriented Pain management: pain level controlled Vital Signs Assessment: post-procedure vital signs reviewed and stable Respiratory status: spontaneous breathing and respiratory function stable Cardiovascular status: blood pressure returned to baseline and stable Postop Assessment: no apparent nausea or vomiting Anesthetic complications: no Comments: Patient c/o itching in PACU,Diphenhydramine 25 mg and dexamethasone 4 mg iv were given.   No notable events documented.   Last Vitals:  Vitals:   04/07/21 1045 04/07/21 1104  BP: 121/81 110/77  Pulse: 75 64  Resp: 15 17  Temp:  36.5 C  SpO2: 100% 98%    Last Pain:  Vitals:   04/07/21 1104  TempSrc: Axillary  PainSc: 0-No pain                 Aviyana Sonntag C Aseel Truxillo

## 2021-04-07 NOTE — Transfer of Care (Signed)
Immediate Anesthesia Transfer of Care Note  Patient: Marie Barnes  Procedure(s) Performed: LAPAROSCOPIC CHOLECYSTECTOMY (Abdomen)  Patient Location: PACU  Anesthesia Type:General  Level of Consciousness: awake, alert , oriented and patient cooperative  Airway & Oxygen Therapy: Patient Spontanous Breathing  Post-op Assessment: Report given to RN, Post -op Vital signs reviewed and stable and Patient moving all extremities  Post vital signs: Reviewed and stable  Last Vitals:  Vitals Value Taken Time  BP    Temp    Pulse 90 04/07/21 1007  Resp 22 04/07/21 1007  SpO2 90 % 04/07/21 1007  Vitals shown include unvalidated device data.  Last Pain:  Vitals:   04/07/21 0736  TempSrc: Oral  PainSc: 0-No pain      Patients Stated Pain Goal: 8 (86/38/17 7116)  Complications: No notable events documented.

## 2021-04-07 NOTE — Anesthesia Preprocedure Evaluation (Signed)
Anesthesia Evaluation  Patient identified by MRN, date of birth, ID band Patient awake    Reviewed: Allergy & Precautions, NPO status , Patient's Chart, lab work & pertinent test results  History of Anesthesia Complications Negative for: history of anesthetic complications  Airway Mallampati: I  TM Distance: >3 FB Neck ROM: Full    Dental  (+) Dental Advisory Given, Missing, Chipped   Pulmonary Current Smoker and Patient abstained from smoking.,    Pulmonary exam normal breath sounds clear to auscultation       Cardiovascular Exercise Tolerance: Good Normal cardiovascular exam Rhythm:Regular Rate:Normal     Neuro/Psych negative neurological ROS  negative psych ROS   GI/Hepatic negative GI ROS, Neg liver ROS,   Endo/Other  negative endocrine ROS  Renal/GU negative Renal ROS  negative genitourinary   Musculoskeletal negative musculoskeletal ROS (+)   Abdominal   Peds  Hematology negative hematology ROS (+)   Anesthesia Other Findings   Reproductive/Obstetrics negative OB ROS                            Anesthesia Physical Anesthesia Plan  ASA: 2  Anesthesia Plan: General   Post-op Pain Management:    Induction: Intravenous  PONV Risk Score and Plan: 4 or greater and Ondansetron, Dexamethasone, Midazolam and Scopolamine patch - Pre-op  Airway Management Planned: Oral ETT  Additional Equipment:   Intra-op Plan:   Post-operative Plan: Extubation in OR  Informed Consent: I have reviewed the patients History and Physical, chart, labs and discussed the procedure including the risks, benefits and alternatives for the proposed anesthesia with the patient or authorized representative who has indicated his/her understanding and acceptance.     Dental advisory given  Plan Discussed with: CRNA and Surgeon  Anesthesia Plan Comments:        Anesthesia Quick Evaluation

## 2021-04-07 NOTE — Progress Notes (Signed)
Pt complained of itching all over after given Dilaudid in PACU. RN made Dr. Charna Elizabeth aware. Pt was given Benadryl 25 mg IV and Decadron 4 mg IV at 1122 per Dr. Charna Elizabeth No rash noted. Pt's itching was relieved before she was discharged.

## 2021-04-07 NOTE — Interval H&P Note (Signed)
History and Physical Interval Note:  04/07/2021 7:56 AM  Marie Barnes  has presented today for surgery, with the diagnosis of Cholelithiasis.  The various methods of treatment have been discussed with the patient and family. After consideration of risks, benefits and other options for treatment, the patient has consented to  Procedure(s): LAPAROSCOPIC CHOLECYSTECTOMY (N/A) as a surgical intervention.  The patient's history has been reviewed, patient examined, no change in status, stable for surgery.  I have reviewed the patient's chart and labs.  Questions were answered to the patient's satisfaction.     Aviva Signs

## 2021-04-07 NOTE — Op Note (Signed)
Patient:  Marie Barnes  DOB:  12/20/1975  MRN:  [id number]   Preop Diagnosis: Biliary colic, cholelithiasis  Postop Diagnosis: Same  Procedure: Laparoscopic cholecystectomy  Surgeon: Aviva Signs, MD  Anes: General endotracheal  Indications: Patient is a 45 year old white female who presents with biliary colic secondary to cholelithiasis.  The risks and benefits of the procedure including bleeding, infection, hepatobiliary injury, and the possibility of an open procedure were fully explained to the patient, who gave informed consent.  Procedure note: The patient was placed in the supine position.  After induction of general endotracheal anesthesia, the abdomen was prepped and draped using the usual sterile technique with ChloraPrep.  Surgical site confirmation was performed.  A supraumbilical incision was made down to the fascia.  A Veress needle was introduced into the abdominal cavity and confirmation of placement was done using the saline drop test.  The abdomen was then insufflated to 15 mmHg pressure.  A 1 mm trocar was introduced into the abdominal cavity under direct visualization without difficulty.  The patient was placed in reverse Trendelenburg position and an additional 11 mm trocar was placed in the epigastric region and 5 mm trochars were placed in the right upper quadrant and right flank regions.  There were some adhesions of the liver to the anterior abdominal wall and these were taken down using Bovie electrocautery.  The gallbladder was then retracted in a dynamic fashion in order to provide a critical view of the triangle of Calot.  The cystic duct was first identified.  Its juncture to the infundibulum was fully identified.  Endoclips were placed proximally and distally on the cystic duct, and the cystic duct was divided.  This was likewise done and the cystic artery.  The gallbladder was freed away from the gallbladder fossa using Bovie electrocautery.  The gallbladder  was delivered through the epigastric trocar site using an Endo Catch bag.  Gallbladder fossa was inspected and no abnormal bleeding or bile leakage was noted.  Surgicel was placed in the gallbladder fossa.  All fluid and air were then evacuated from the abdominal cavity prior to removal of the trochars.  All wounds were irrigated with normal saline.  All wounds were injected with Exparel.  The incisions were all closed using 4-0 Monocryl subcuticular sutures.  Dermabond was applied.  All tape and needle counts were correct at the end of the procedure.  The patient was extubated in the operating room and transferred to PACU in stable condition.  Complications: None  EBL: Minimal  Specimen: Gallbladder

## 2021-04-07 NOTE — Anesthesia Procedure Notes (Signed)
Procedure Name: Intubation Date/Time: 04/07/2021 9:20 AM Performed by: Tacy Learn, CRNA Pre-anesthesia Checklist: Patient identified, Emergency Drugs available, Suction available, Patient being monitored and Timeout performed Patient Re-evaluated:Patient Re-evaluated prior to induction Oxygen Delivery Method: Circle system utilized Preoxygenation: Pre-oxygenation with 100% oxygen Induction Type: IV induction Laryngoscope Size: Miller and 2 Grade View: Grade I Tube size: 7.0 mm Number of attempts: 1 Airway Equipment and Method: Stylet Placement Confirmation: ETT inserted through vocal cords under direct vision, positive ETCO2, CO2 detector and breath sounds checked- equal and bilateral Secured at: 21 cm Tube secured with: Tape Dental Injury: Teeth and Oropharynx as per pre-operative assessment

## 2021-04-07 NOTE — Discharge Instructions (Signed)
PLEASE REMOVE THE SCOPOLAMINE PATCH BEHIND YOUR RIGHT EAR ON Monday April 10, 2021. [facility] HANDS AFTER REMOVAL   PLEASE [facility] UNTIL Tuesday April 11, 2021. DO NOT USE ANY NUMBING MEDICATIONS WITHOUT CONSULTING A PHYSICIAN UNTIL AFTER Tuesday

## 2021-04-10 ENCOUNTER — Encounter (HOSPITAL_COMMUNITY): Payer: Self-pay | Admitting: General Surgery

## 2021-04-10 LAB — SURGICAL PATHOLOGY

## 2021-04-12 ENCOUNTER — Telehealth: Payer: Self-pay | Admitting: Family Medicine

## 2021-04-12 NOTE — Telephone Encounter (Signed)
FMLA paperwork filled out and faxed to [email or web address]  - [phone] and received confirmation.   Out of work 04/07/2021-04/24/2001

## 2021-04-26 ENCOUNTER — Telehealth (INDEPENDENT_AMBULATORY_CARE_PROVIDER_SITE_OTHER): Payer: BC Managed Care – PPO | Admitting: General Surgery

## 2021-04-26 DIAGNOSIS — Z09 Encounter for follow-up examination after completed treatment for conditions other than malignant neoplasm: Secondary | ICD-10-CM

## 2021-04-26 NOTE — Telephone Encounter (Signed)
Left message to call my office with any questions.

## 2021-05-02 ENCOUNTER — Telehealth (INDEPENDENT_AMBULATORY_CARE_PROVIDER_SITE_OTHER): Payer: BC Managed Care – PPO | Admitting: General Surgery

## 2021-05-02 ENCOUNTER — Other Ambulatory Visit (INDEPENDENT_AMBULATORY_CARE_PROVIDER_SITE_OTHER): Payer: BC Managed Care – PPO | Admitting: General Surgery

## 2021-05-02 DIAGNOSIS — Z09 Encounter for follow-up examination after completed treatment for conditions other than malignant neoplasm: Secondary | ICD-10-CM

## 2021-05-02 MED ORDER — HYDROCODONE-ACETAMINOPHEN 5-325 MG PO TABS: 1.0000 | ORAL_TABLET | ORAL | 0 refills | Status: AC | PRN

## 2021-05-02 MED ORDER — CIPROFLOXACIN HCL 500 MG PO TABS: 500.0000 mg | ORAL_TABLET | Freq: Two times a day (BID) | ORAL | 0 refills | Status: AC

## 2021-05-02 NOTE — Telephone Encounter (Signed)
Patient called the office today.  She states that she has a burning sensation when she urinates.  She does not have a primary care physician.  She has been doing well since the surgery but have still having mild incisional pain.  She denies any fever or chills.  She has had some bowel urgency, but this seems to be getting a little bit better.  I told her to take yogurt and that the bowel urgency should resolve with time.  It is not uncommon after gallbladder surgery.  I did prescribe ciprofloxacin for her urinary tract infection.  I did reorder her hydrocodone.  She was instructed to call me should any other problems arise.  Follow-up as needed.  As this was a part of the global surgical fee, this was not a billable visit.  Total telephone time was 3 minutes.

## 2021-05-02 NOTE — Progress Notes (Signed)
Ciprofloxacin for UTI as patient does not have primary care physician.  Reordered hydrocodone for pain.  Follow up prn.

## 2021-06-28 ENCOUNTER — Emergency Department (HOSPITAL_COMMUNITY): Payer: BC Managed Care – PPO

## 2021-06-28 ENCOUNTER — Encounter (HOSPITAL_COMMUNITY): Payer: Self-pay | Admitting: Emergency Medicine

## 2021-06-28 ENCOUNTER — Emergency Department (HOSPITAL_COMMUNITY)
Admission: EM | Admit: 2021-06-28 | Discharge: 2021-06-28 | Disposition: A | Discharge status: Home / Self Care | Payer: BC Managed Care – PPO | Attending: Emergency Medicine | Admitting: Emergency Medicine

## 2021-06-28 DIAGNOSIS — Z87891 Personal history of nicotine dependence: Secondary | ICD-10-CM | POA: Insufficient documentation

## 2021-06-28 DIAGNOSIS — R509 Fever, unspecified: Secondary | ICD-10-CM | POA: Diagnosis present

## 2021-06-28 DIAGNOSIS — U071 COVID-19: Secondary | ICD-10-CM | POA: Insufficient documentation

## 2021-06-28 LAB — BASIC METABOLIC PANEL
Anion gap: 7 (ref 5–15)
BUN: 8 mg/dL (ref 6–20)
CO2: 24 mmol/L (ref 22–32)
Calcium: 8.9 mg/dL (ref 8.9–10.3)
Chloride: 105 mmol/L (ref 98–111)
Creatinine, Ser: 0.88 mg/dL (ref 0.44–1.00)
GFR, Estimated: >60 mL/min (ref >60)
Glucose, Bld: 85 mg/dL (ref 70–99)
Potassium: 4 mmol/L (ref 3.5–5.1)
Sodium: 136 mmol/L (ref 135–145)

## 2021-06-28 LAB — I-STAT BETA HCG BLOOD, ED (MC, WL, AP ONLY): I-stat hCG, quantitative: <5 m[IU]/mL (ref <5)

## 2021-06-28 LAB — RESP PANEL BY RT-PCR (FLU A&B, COVID) ARPGX2
Influenza A by PCR: NEGATIVE
Influenza B by PCR: NEGATIVE
SARS Coronavirus 2 by RT PCR: POSITIVE — AB

## 2021-06-28 MED ORDER — SODIUM CHLORIDE 0.9 % IV BOLUS
1000.0000 mL | Freq: Once | INTRAVENOUS | Status: AC
  Administered 2021-06-28: 1000 mL via INTRAVENOUS

## 2021-06-28 MED ORDER — METOCLOPRAMIDE HCL 5 MG/ML IJ SOLN
10.0000 mg | Freq: Once | INTRAMUSCULAR | Status: AC
  Administered 2021-06-28: 10 mg via INTRAVENOUS
  Filled 2021-06-28: qty 2

## 2021-06-28 MED ORDER — DIPHENHYDRAMINE HCL 50 MG/ML IJ SOLN
25.0000 mg | Freq: Once | INTRAMUSCULAR | Status: AC
  Administered 2021-06-28: 25 mg via INTRAVENOUS
  Filled 2021-06-28: qty 1

## 2021-06-28 MED ORDER — NIRMATRELVIR/RITONAVIR (PAXLOVID)TABLET: 3.0000 | ORAL_TABLET | Freq: Two times a day (BID) | ORAL | 0 refills | Status: AC

## 2021-06-28 NOTE — Discharge Instructions (Addendum)
As discussed, you have been diagnosed with COVID.  Please monitor your condition carefully and do not hesitate to return here for concerning changes.

## 2021-06-28 NOTE — ED Notes (Signed)
Put pt on cardiac monitor 

## 2021-06-28 NOTE — ED Provider Notes (Signed)
[facility] Provider Note   CSN: [id number] Arrival date & time: 06/28/21  0820     History No chief complaint on file.   Marie [facility] is a 45 y.o. female.  HPI Patient with a history of cigarette addiction presents with 3 days of fever, chills, nausea, vomiting, myalgia. Onset was without clear precipitant, since onset symptoms been persistent, with no relief with OTC medication. No focal pain, no focal weakness.    Past Medical History:  Diagnosis Date   Closed posterior dislocation of left hip ([place]) 07/03/2019   Marijuana abuse 07/22/2019   Nicotine dependence 07/03/2019   Vitamin D deficiency 07/22/2019    Patient Active Problem List   Diagnosis Date Noted   Calculus of gallbladder without cholecystitis without obstruction    Cocaine use 07/22/2019   Vitamin D deficiency 07/22/2019   Marijuana abuse 07/22/2019   Alcohol intoxication ([place]) 07/22/2019   Closed posterior dislocation of left hip ([place]) 07/03/2019   Nicotine dependence 07/03/2019   Fracture of femoral head ([place]) 07/02/2019    Past Surgical History:  Procedure Laterality Date   BREAST SURGERY     CHOLECYSTECTOMY N/A 04/07/2021   Procedure: LAPAROSCOPIC CHOLECYSTECTOMY;  Surgeon: Aviva Signs, MD;  Location: [facility];  Service: General;  Laterality: N/A;   FRACTURE SURGERY     left hip   TUBAL LIGATION       OB History     Gravida      Para      Term      Preterm      AB      Living  3      SAB      IAB      Ectopic      Multiple      Live Births              Family History  Problem Relation Age of Onset   Diabetes Other     Social History   Tobacco Use   Smoking status: Former    Packs/day: 1.00    Types: Cigarettes   Smokeless tobacco: Never  Vaping Use   Vaping Use: Never used  Substance Use Topics   Alcohol use: Yes    Comment: occasional social drinker   Drug use: Yes    Types: Marijuana    Comment: denies    Home  Medications Prior to Admission medications   Medication Sig Start Date End Date Taking? Authorizing Provider  ciprofloxacin (CIPRO) 500 MG tablet Take 1 tablet (500 mg total) by mouth 2 (two) times daily. 05/02/21   Aviva Signs, MD  nirmatrelvir/ritonavir EUA (PAXLOVID) 20 x 150 MG & 10 x '100MG'$  TABS Take 3 tablets by mouth 2 (two) times daily for 5 days. Patient GFR is >60. Take nirmatrelvir (150 mg) two tablets twice daily for 5 days and ritonavir (100 mg) one tablet twice daily for 5 days. 06/28/21 07/03/21 Yes Carmin Muskrat, MD  HYDROcodone-acetaminophen (NORCO) 5-325 MG tablet Take 1 tablet by mouth every 4 (four) hours as needed for moderate pain. 05/02/21   Aviva Signs, MD  promethazine (PHENERGAN) 12.5 MG tablet Take 1 tablet (12.5 mg total) by mouth every 6 (six) hours as needed for nausea or vomiting. 04/04/21   Aviva Signs, MD    Allergies    Asa [aspirin] and Penicillins  Review of Systems   Review of Systems  Constitutional:        Per HPI, otherwise negative  HENT:  Per HPI, otherwise negative  Respiratory:         Per HPI, otherwise negative  Cardiovascular:        Per HPI, otherwise negative  Gastrointestinal:  Positive for nausea and vomiting.  Endocrine:       Negative aside from HPI  Genitourinary:        Neg aside from HPI   Musculoskeletal:        Per HPI, otherwise negative  Skin: Negative.   Neurological:  Negative for syncope.   Physical Exam Updated Vital Signs BP 114/78   Pulse (!) 59   Temp 97.7 F (36.5 C) (Oral)   Resp 13   LMP 04/11/2021 (Approximate)   SpO2 100%   Physical Exam Vitals and nursing note reviewed.  Constitutional:      General: She is not in acute distress.    Appearance: She is well-developed.  HENT:     Head: Normocephalic and atraumatic.  Eyes:     Conjunctiva/sclera: Conjunctivae normal.  Cardiovascular:     Rate and Rhythm: Normal rate and regular rhythm.  Pulmonary:     Effort: Pulmonary effort is normal.  No respiratory distress.     Breath sounds: Normal breath sounds. No stridor.  Abdominal:     General: There is no distension.     Tenderness: There is no abdominal tenderness. There is no guarding.  Skin:    General: Skin is warm and dry.  Neurological:     Mental Status: She is alert and oriented to person, place, and time.     Cranial Nerves: No cranial nerve deficit.    ED Results / Procedures / Treatments   Labs (all labs ordered are listed, but only abnormal results are displayed) Labs Reviewed  RESP PANEL BY RT-PCR (FLU A&B, COVID) ARPGX2 - Abnormal; Notable for the following components:      Result Value   SARS Coronavirus 2 by RT PCR POSITIVE (*)    All other components within normal limits  BASIC METABOLIC PANEL  I-STAT BETA HCG BLOOD, ED ([facility], [facility], [facility] ONLY)    EKG None  Radiology DG Chest Port 1 View  Result Date: 06/28/2021 CLINICAL DATA:  Smoker, rule out pneumonia EXAM: PORTABLE CHEST 1 VIEW COMPARISON:  07/02/2019 FINDINGS: The heart size and mediastinal contours are within normal limits. Both lungs are clear. The visualized skeletal structures are unremarkable. IMPRESSION: No acute abnormality of the lungs in [facility] portable projection. Electronically Signed   By: Eddie Candle M.D.   On: 06/28/2021 09:50    Procedures Procedures   Medications Ordered in ED Medications  sodium chloride 0.9 % bolus 1,000 mL ( Intravenous Stopped 06/28/21 1037)  metoCLOPramide (REGLAN) injection 10 mg (10 mg Intravenous Given 06/28/21 0929)  diphenhydrAMINE (BENADRYL) injection 25 mg (25 mg Intravenous Given 06/28/21 [id number])    ED Course  I have reviewed the triage vital signs and the nursing notes.  Pertinent labs & imaging results that were available during my care of the patient were reviewed by me and considered in my medical decision making (see chart for details).   11:10 AM Patient in no distress, remains hemodynamically unremarkable.  I discussed all findings, discussed with  her x-ray, COVID results, patient found to be COVID-positive. No distress, no increased work of breathing, no concurrent pneumonia.  Patient appropriate for initiation of outpatient therapy. MDM Rules/Calculators/A&P MDM Number of Diagnoses or Management Options COVID: new, needed workup   Amount and/or Complexity of Data Reviewed Clinical lab tests:  ordered and reviewed Tests in the radiology section of CPT: ordered and reviewed Tests in the medicine section of CPT: reviewed and ordered Decide to obtain previous medical records or to obtain history from someone other than the patient: yes Review and summarize past medical records: yes Independent visualization of images, tracings, or specimens: yes  Risk of Complications, Morbidity, and/or Mortality Presenting problems: high Diagnostic procedures: high Management options: high  Critical Care Total time providing critical care: < 30 minutes  Patient Progress Patient progress: stable   Final Clinical Impression(s) / ED Diagnoses Final diagnoses:  COVID    Rx / DC Orders ED Discharge Orders          Ordered    nirmatrelvir/ritonavir EUA (PAXLOVID) 20 x 150 MG & 10 x '100MG'$  TABS  2 times daily        06/28/21 1109             Carmin Muskrat, MD 06/28/21 1111

## 2021-06-28 NOTE — ED Triage Notes (Signed)
Pt was vomiting on Monday, then started having body aches last night.

## 2021-07-18 ENCOUNTER — Emergency Department (HOSPITAL_COMMUNITY): Payer: BC Managed Care – PPO

## 2021-07-18 ENCOUNTER — Encounter (HOSPITAL_COMMUNITY): Payer: Self-pay | Admitting: *Deleted

## 2021-07-18 ENCOUNTER — Other Ambulatory Visit: Payer: Self-pay

## 2021-07-18 ENCOUNTER — Emergency Department (HOSPITAL_COMMUNITY)
Admission: EM | Admit: 2021-07-18 | Discharge: 2021-07-18 | Disposition: A | Discharge status: Home / Self Care | Payer: BC Managed Care – PPO | Attending: Emergency Medicine | Admitting: Emergency Medicine

## 2021-07-18 DIAGNOSIS — Y92009 Unspecified place in unspecified non-institutional (private) residence as the place of occurrence of the external cause: Secondary | ICD-10-CM | POA: Diagnosis not present

## 2021-07-18 DIAGNOSIS — S79912A Unspecified injury of left hip, initial encounter: Secondary | ICD-10-CM | POA: Diagnosis present

## 2021-07-18 DIAGNOSIS — Z87891 Personal history of nicotine dependence: Secondary | ICD-10-CM | POA: Diagnosis not present

## 2021-07-18 DIAGNOSIS — S7002XA Contusion of left hip, initial encounter: Secondary | ICD-10-CM | POA: Insufficient documentation

## 2021-07-18 DIAGNOSIS — W19XXXA Unspecified fall, initial encounter: Secondary | ICD-10-CM | POA: Diagnosis not present

## 2021-07-18 MED ORDER — DICLOFENAC SODIUM 75 MG PO TBEC: 75.0000 mg | DELAYED_RELEASE_TABLET | Freq: Two times a day (BID) | ORAL | 0 refills | Status: AC

## 2021-07-18 NOTE — Discharge Instructions (Addendum)
Follow up with your primary care MD and see Orthopaedist for recheck

## 2021-07-18 NOTE — ED Provider Notes (Signed)
[facility] Provider Note   CSN: [id number] Arrival date & time: 07/18/21  1150     History Chief Complaint  Patient presents with   Hip Pain    Marie [facility] is a 45 y.o. female.  The history is provided by the patient. No language interpreter was used.  Hip Pain This is a new problem. Episode onset: 3 daysa go. The problem occurs constantly. The problem has not changed since onset.Nothing aggravates the symptoms. Nothing relieves the symptoms. She has tried nothing for the symptoms. The treatment provided no relief.  Pt fell and hit hip.  Pt complains of pain with walking.  Pt had a prvious left sided hip injury.      Past Medical History:  Diagnosis Date   Closed posterior dislocation of left hip ([place]) 07/03/2019   Marijuana abuse 07/22/2019   Nicotine dependence 07/03/2019   Vitamin D deficiency 07/22/2019    Patient Active Problem List   Diagnosis Date Noted   Calculus of gallbladder without cholecystitis without obstruction    Cocaine use 07/22/2019   Vitamin D deficiency 07/22/2019   Marijuana abuse 07/22/2019   Alcohol intoxication ([place]) 07/22/2019   Closed posterior dislocation of left hip ([place]) 07/03/2019   Nicotine dependence 07/03/2019   Fracture of femoral head ([place]) 07/02/2019    Past Surgical History:  Procedure Laterality Date   BREAST SURGERY     CHOLECYSTECTOMY N/A 04/07/2021   Procedure: LAPAROSCOPIC CHOLECYSTECTOMY;  Surgeon: Aviva Signs, MD;  Location: [facility];  Service: General;  Laterality: N/A;   FRACTURE SURGERY     left hip   TUBAL LIGATION       OB History     Gravida      Para      Term      Preterm      AB      Living  3      SAB      IAB      Ectopic      Multiple      Live Births              Family History  Problem Relation Age of Onset   Diabetes Other     Social History   Tobacco Use   Smoking status: Former    Packs/day: 1.00    Types: Cigarettes   Smokeless tobacco:  Never  Vaping Use   Vaping Use: Never used  Substance Use Topics   Alcohol use: Yes    Comment: occasional social drinker   Drug use: Yes    Types: Marijuana    Comment: denies    Home Medications Prior to Admission medications   Medication Sig Start Date End Date Taking? Authorizing Provider  ciprofloxacin (CIPRO) 500 MG tablet Take 1 tablet (500 mg total) by mouth 2 (two) times daily. Patient not taking: Reported on 07/18/2021 05/02/21   Aviva Signs, MD  HYDROcodone-acetaminophen [facility]) 5-325 MG tablet Take 1 tablet by mouth every 4 (four) hours as needed for moderate pain. Patient not taking: Reported on 07/18/2021 05/02/21   Aviva Signs, MD  promethazine (PHENERGAN) 12.5 MG tablet Take 1 tablet (12.5 mg total) by mouth every 6 (six) hours as needed for nausea or vomiting. Patient not taking: Reported on 07/18/2021 04/04/21   Aviva Signs, MD    Allergies    Asa [aspirin] and Penicillins  Review of Systems   Review of Systems  All other systems reviewed and are negative.  Physical Exam  Updated Vital Signs BP 106/66   Pulse 75   Temp 98 F (36.7 C)   Resp 20   LMP 07/13/2021   SpO2 100%   Physical Exam Vitals reviewed.  Constitutional:      Appearance: Normal appearance.  Cardiovascular:     Rate and Rhythm: Normal rate.  Pulmonary:     Effort: Pulmonary effort is normal.  Musculoskeletal:        General: Tenderness present. No deformity.  Skin:    General: Skin is warm.  Neurological:     General: No focal deficit present.     Mental Status: She is alert.  Psychiatric:        Mood and Affect: Mood normal.    ED Results / Procedures / Treatments   Labs (all labs ordered are listed, but only abnormal results are displayed) Labs Reviewed - No data to display  EKG None  Radiology CT Hip Left Wo Contrast  Result Date: 07/18/2021 CLINICAL DATA:  Fall 3 days ago. Left hip pain. History of previous left hip fracture EXAM: CT OF THE LEFT HIP WITHOUT  CONTRAST TECHNIQUE: Multidetector CT imaging of the left hip was performed according to the standard protocol. Multiplanar CT image reconstructions were also generated. COMPARISON:  CT 11/10/2019, x-ray 07/18/2021 FINDINGS: Bones/Joint/Cartilage Chronic fracture deformity of the anteromedial aspect of the left femoral head. Redemonstrated multiple adjacent corticated osseous fragments, likely representing a combination of chronic fracture fragments and heterotopic ossification. Compared to the prior CT, there is progressive osteoarthritis of the left hip joint with bone-on-bone apposition and subchondral cystic changes at the anterosuperior aspect of the joint. Prominent marginal osteophytes. Probable small left hip joint effusion, nonspecific. No evidence of an acute fracture. No dislocation. No suspicious bone lesion. Ligaments Suboptimally assessed by CT. Muscles and Tendons No acute musculotendinous abnormality by CT. Soft tissues No soft tissue swelling or hematoma. IMPRESSION: 1. No acute fracture or dislocation of the left hip. 2. Chronic fracture deformity of the left femoral head with multiple adjacent corticated osseous fragments, likely representing a combination of chronic fracture fragments and heterotopic ossification. 3. 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 Electronically Signed   By: Davina Poke D.O.   On: 07/18/2021 15:08   DG Hip Unilat With Pelvis 2-3 Views Left  Result Date: 07/18/2021 CLINICAL DATA:  Fall, left hip pain EXAM: DG HIP (WITH OR WITHOUT PELVIS) 2-3V LEFT COMPARISON:  01/11/2020 FINDINGS: Chronic posttraumatic deformity of the left femoral head. Adjacent chronic fracture fragments and/or heterotopic ossification adjacent to the left hip joint. Progressive joint space loss at the left hip. Right hip joint space is maintained. No dislocation. Pelvic bony ring intact. No soft tissue abnormality. IMPRESSION: Progressive posttraumatic  osteoarthritis of the left hip. No new or acute findings. Electronically Signed   By: Davina Poke D.O.   On: 07/18/2021 14:02    Procedures Procedures   Medications Ordered in ED Medications - No data to display  ED Course  I have reviewed the triage vital signs and the nursing notes.  Pertinent labs & imaging results that were available during my care of the patient were reviewed by me and considered in my medical decision making (see chart for details).    MDM Rules/Calculators/A&P                           MDM xray shows chronic changes.  Ct scan  no acute fracture, chronic changes.  Pt  request referral to Orthopaedist.   Final Clinical Impression(s) / ED Diagnoses Final diagnoses:  Fall, initial encounter  Contusion of left hip, initial encounter    Rx / DC Orders ED Discharge Orders     None     An After Visit Summary was printed and given to the patient.    Sidney Ace 07/18/21 1627    Daleen Bo, MD 07/18/21 2013

## 2021-07-18 NOTE — ED Triage Notes (Signed)
Fell at home 3 DAYS AGO. PAIN IN LEFT HIP

## 2022-03-29 ENCOUNTER — Emergency Department (HOSPITAL_COMMUNITY)
Admission: EM | Admit: 2022-03-29 | Discharge: 2022-03-29 | Disposition: A | Discharge status: Home / Self Care | Payer: Self-pay | Attending: Emergency Medicine | Admitting: Emergency Medicine

## 2022-03-29 ENCOUNTER — Other Ambulatory Visit: Payer: Self-pay

## 2022-03-29 ENCOUNTER — Emergency Department (HOSPITAL_COMMUNITY): Payer: Self-pay

## 2022-03-29 ENCOUNTER — Encounter (HOSPITAL_COMMUNITY): Payer: Self-pay | Admitting: Emergency Medicine

## 2022-03-29 DIAGNOSIS — W57XXXA Bitten or stung by nonvenomous insect and other nonvenomous arthropods, initial encounter: Secondary | ICD-10-CM | POA: Insufficient documentation

## 2022-03-29 DIAGNOSIS — L03115 Cellulitis of right lower limb: Secondary | ICD-10-CM | POA: Insufficient documentation

## 2022-03-29 DIAGNOSIS — Z23 Encounter for immunization: Secondary | ICD-10-CM | POA: Insufficient documentation

## 2022-03-29 MED ORDER — OXYCODONE-ACETAMINOPHEN 5-325 MG PO TABS
1.0000 | ORAL_TABLET | Freq: Once | ORAL | Status: AC
  Administered 2022-03-29: 1 via ORAL
  Filled 2022-03-29: qty 1

## 2022-03-29 MED ORDER — DOXYCYCLINE HYCLATE 100 MG PO CAPS: 100.0000 mg | ORAL_CAPSULE | Freq: Two times a day (BID) | ORAL | 0 refills | Status: AC

## 2022-03-29 MED ORDER — TETANUS-DIPHTH-ACELL PERTUSSIS 5-2.5-18.5 LF-MCG/0.5 IM SUSY
0.5000 mL | PREFILLED_SYRINGE | Freq: Once | INTRAMUSCULAR | Status: AC
  Administered 2022-03-29: 0.5 mL via INTRAMUSCULAR
  Filled 2022-03-29: qty 0.5

## 2022-03-29 NOTE — ED Triage Notes (Signed)
Pt believes she was bite by spider on right foot, foot red, swollen and warm to touch, pulses strong.

## 2022-03-29 NOTE — Discharge Instructions (Signed)
Likely you have a skin infection, uncertain antibiotics please take as prescribed.  Please keep the area clean, you may use over-the-counter pain medication as needed.  If you notice after 3 time of using the antibiotics the redness or swelling has worsened please come back in for reassessment.  Please follow-up with the health department as needed  Come back to the emergency department if you develop chest pain, shortness of breath, severe abdominal pain, uncontrolled nausea, vomiting, diarrhea.

## 2022-03-29 NOTE — ED Provider Notes (Cosign Needed Addendum)
[facility] EMERGENCY DEPARTMENT Provider Note   CSN: [id number] Arrival date & time: 03/29/22  1107     History  Chief Complaint  Patient presents with   Insect Bite    [facility] is a 46 y.o. female.  HPI  Without significant medical history presents with complaints of wound on the right foot.  Patient states yesterday she was wearing sandals, and later that day she noticed a mark on the anterior aspect of the MTP joint of the first toe, area become red swollen and painful, she is unsure if she was bit by something, she states she has some paresthesias in her big toe was able to move it, she denies any trauma to the area, she is not diabetic, no history of MRSA, not immunocompromise, she is not up-to-date on her tetanus shot.  Home Medications Prior to Admission medications   Medication Sig Start Date End Date Taking? Authorizing Provider  ciprofloxacin (CIPRO) 500 MG tablet Take 1 tablet (500 mg total) by mouth 2 (two) times daily. Patient not taking: Reported on 07/18/2021 05/02/21   Aviva Signs, MD  doxycycline (VIBRAMYCIN) 100 MG capsule Take 1 capsule (100 mg total) by mouth 2 (two) times daily for 7 days. 03/29/22 04/05/22 Yes Marcello Fennel, PA-C  diclofenac (VOLTAREN) 75 MG EC tablet Take 1 tablet (75 mg total) by mouth 2 (two) times daily. Patient not taking: Reported on 03/29/2022 07/18/21   Fransico Meadow, PA-C  HYDROcodone-acetaminophen [facility]) 5-325 MG tablet Take 1 tablet by mouth every 4 (four) hours as needed for moderate pain. Patient not taking: Reported on 07/18/2021 05/02/21   Aviva Signs, MD  promethazine (PHENERGAN) 12.5 MG tablet Take 1 tablet (12.5 mg total) by mouth every 6 (six) hours as needed for nausea or vomiting. Patient not taking: Reported on 07/18/2021 04/04/21   Aviva Signs, MD      Allergies    Asa [aspirin] and Penicillins    Review of Systems   Review of Systems  Constitutional:  Negative for chills and fever.  Respiratory:  Negative  for shortness of breath.   Cardiovascular:  Negative for chest pain.  Gastrointestinal:  Negative for abdominal pain.  Skin:  Positive for wound.  Neurological:  Negative for headaches.   Physical Exam Updated Vital Signs BP 128/88   Pulse 78   Temp 97.9 F (36.6 C) (Oral)   Resp 17   Ht '5\' 2"'$  (1.575 m)   Wt 63.5 kg   SpO2 100%   BMI 25.61 kg/m  Physical Exam Vitals and nursing note reviewed.  Constitutional:      General: She is not in acute distress.    Appearance: She is not ill-appearing.  HENT:     Head: Normocephalic and atraumatic.     Nose: No congestion.  Eyes:     Conjunctiva/sclera: Conjunctivae normal.  Cardiovascular:     Rate and Rhythm: Normal rate and regular rhythm.     Pulses: Normal pulses.  Pulmonary:     Effort: Pulmonary effort is normal.  Musculoskeletal:     Comments: Right foot was visualized she has a small circular abrasion on the anterior aspect of the first of the MTP joint, there is surrounding erythema and edema there is no fluctuance or induration present.  It is tender to the touch and warm.  Please see picture for full detail.  Redness does track up slightly to the midfoot, there is no streaking present.  Skin:    General: Skin is warm  and dry.  Neurological:     Mental Status: She is alert.  Psychiatric:        Mood and Affect: Mood normal.       ED Results / Procedures / Treatments   Labs (all labs ordered are listed, but only abnormal results are displayed) Labs Reviewed - No data to display  EKG None  Radiology DG Foot Complete Right  Result Date: 03/29/2022 CLINICAL DATA:  Possible spider bite and cellulitis. EXAM: RIGHT FOOT COMPLETE - 3+ VIEW COMPARISON:  None Available. FINDINGS: There is no evidence of fracture or dislocation. There is no evidence of arthropathy or other focal bone abnormality. Small linear density is seen in the region of the toenail of the first toe. IMPRESSION: No fracture or dislocation is noted.  Small linear density is seen in the region of the toenail of the first toe; foreign body cannot be excluded. Electronically Signed   By: Marijo Conception M.D.   On: 03/29/2022 12:27    Procedures Procedures    Medications Ordered in ED Medications  oxyCODONE-acetaminophen (PERCOCET/ROXICET) 5-325 MG per tablet 1 tablet (1 tablet Oral Given 03/29/22 1301)  Tdap (BOOSTRIX) injection 0.5 mL (0.5 mLs Intramuscular Given 03/29/22 1357)    ED Course/ Medical Decision Making/ A&P                           Medical Decision Making Amount and/or Complexity of Data Reviewed Radiology: ordered.  Risk Prescription drug management.   This patient presents to the ED for concern of right foot pain, this involves an extensive number of treatment options, and is a complaint that carries with it a high risk of complications and morbidity.  The differential diagnosis includes fracture, dislocation, compartment syndrome    Additional history obtained:  Additional history obtained from N/A External records from outside source obtained and reviewed including medication list, medical history, previous ER notes   Co morbidities that complicate the patient evaluation  N/A  Social Determinants of Health:  No primary care provider    Lab Tests:  I Ordered, and personally interpreted labs.  The pertinent results include: N/A   Imaging Studies ordered:  I ordered imaging studies including x-ray of right foot I independently visualized and interpreted imaging which showed negative for acute findings, I agree with the radiologist interpretation   Cardiac Monitoring:  The patient was maintained on a cardiac monitor.  I personally viewed and interpreted the cardiac monitored which showed an underlying rhythm of: N/A   Medicines ordered and prescription drug management:  I ordered medication including Tdap and oxycodone I have reviewed the patients home medicines and have made adjustments as  needed  Critical Interventions:  N/A   Reevaluation:  Presents with right foot pain, triage obtained imaging which I personally reviewed shows questionable foreign body in the big toe which I do not see I feel this is possibly an over read.  Patient is agreement with plan and discharge at this time.  Consultations Obtained:  N/A     Considered:  Admission-deferred as patient is nontoxic-appearing vital signs reassuring there is no streaking up her leg, she has not failed outpatient therapy.  will attempt oral therapy.    Rule out I have low suspicion for septic arthritis as patient denies IV drug use, skin exam was performed no erythematous,, warm joints noted on exam.  Low suspicion for fracture or dislocation as x-ray does not feel any significant findings.  low suspicion for ligament or tendon damage as area was palpated no gross defects noted, they had full range of motion as well as 5/5 strength.  Low suspicion for compartment syndrome as area was palpated it was soft to the touch, neurovascular fully intact.     Dispostion and problem list  After consideration of the diagnostic results and the patients response to treatment, I feel that the patent would benefit from discharge.  Right foot pain-likely cellulitis, started on antibiotics, over-the-counter pain medications, strict return precautions.    Addendum-notified by nursing staff that patient was having left ear pain which was never disclosed to me, she also was concerned that she was being sent home as she is concerned that she was bit by a recluse spider.  I went back in and I assessed her ears there is no evidence of infection, TMs are slightly retracted she had some nasal congestion, likely from allergies, I recommend taking Claritin daily this can also help with the itchiness on her foot.  I explained to her I am not sure if she was bit by a recluse spider or not but my management would not change, I explained that  she does not admission criteria, as she is afebrile nontachycardic, she has redness that is localized just to the wound itself does not track up her leg she has not failed outpatient therapy, nauseous pain to be extremely unlikely for develop sepsis from cellulitis which developed in less than 24 hours, I gave her strict return precautions that if symptoms are worsening please come back in for reassessment.           Final Clinical Impression(s) / ED Diagnoses Final diagnoses:  Cellulitis of right lower extremity    Rx / DC Orders ED Discharge Orders          Ordered    doxycycline (VIBRAMYCIN) 100 MG capsule  2 times daily        03/29/22 1352              Marcello Fennel, PA-C 03/29/22 1353    Marcello Fennel, PA-C 03/29/22 1418    Milton Ferguson, MD 03/31/22 [phone]

## 2022-06-03 IMAGING — US US ABDOMEN LIMITED
1 series · 14 of 25 positions shown · non-contrast
Comparison: November 10, 2019.

CLINICAL DATA: Acute right upper quadrant abdominal pain.

EXAM:
ULTRASOUND ABDOMEN LIMITED RIGHT UPPER QUADRANT

[Series 1: us abdomen limited ruq (liver/gb) · 14 of 48 slices shown]
[im 1/48]
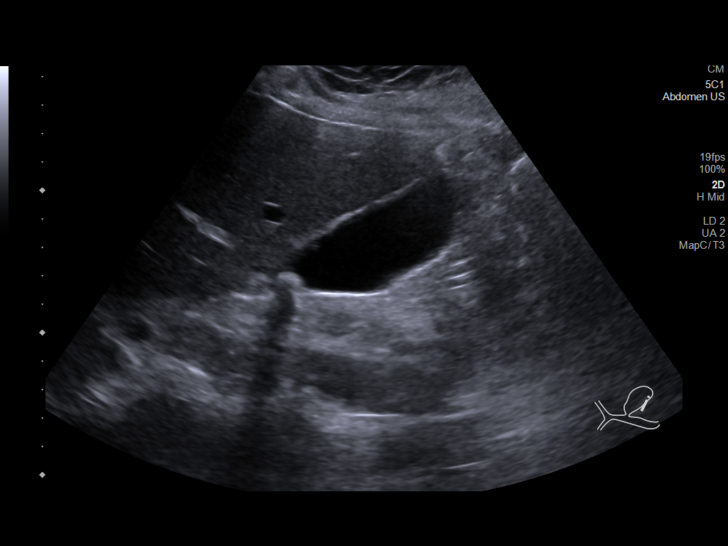
[im 4/48]
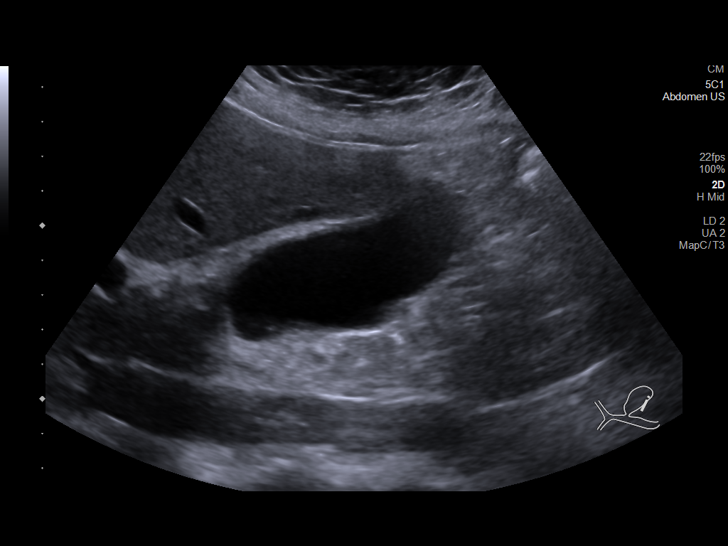
[im 8/48]
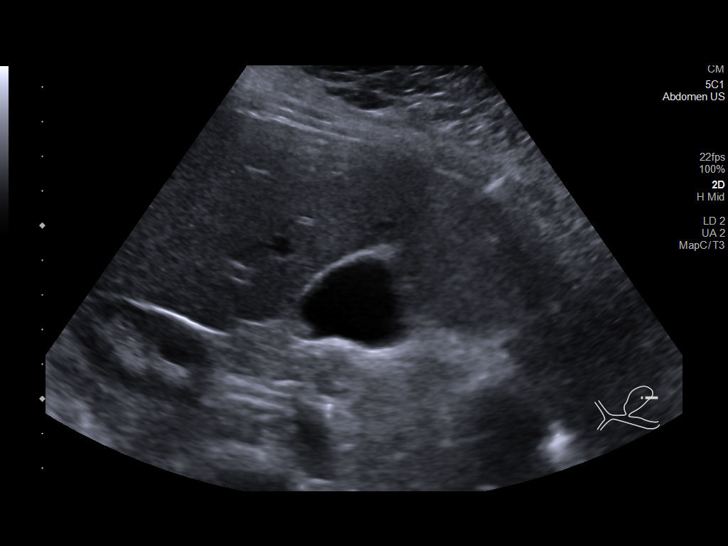
[im 12/48]
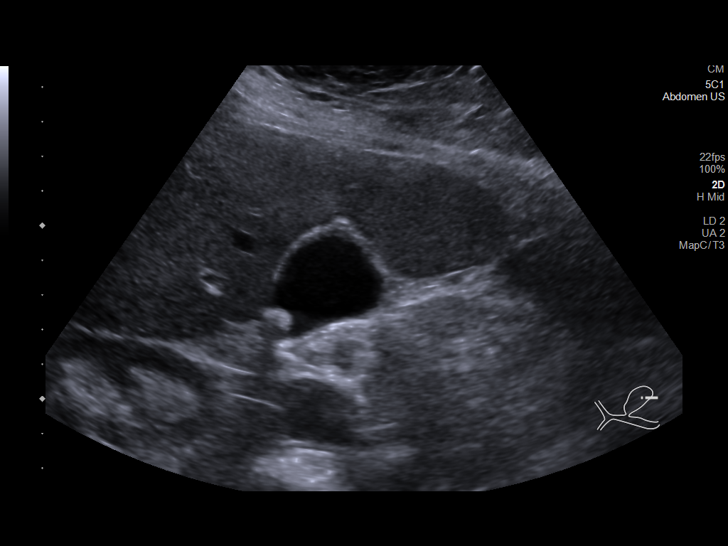
[im 16/48]
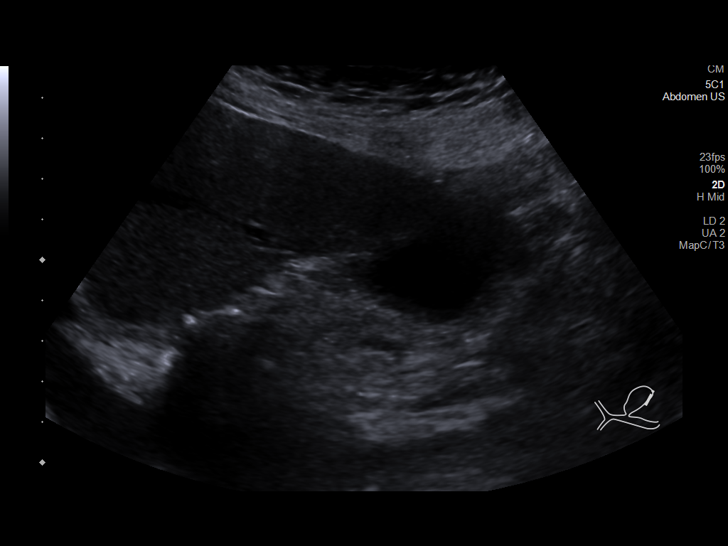
[im 18/48]
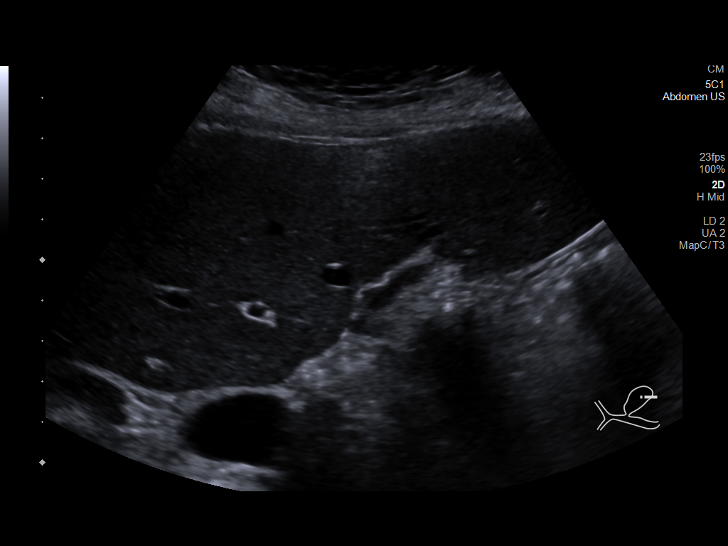
[im 22/48]
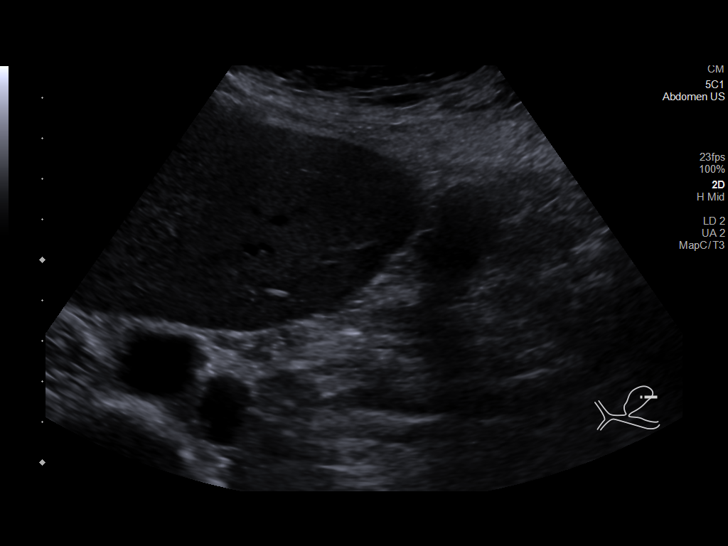
[im 26/48]
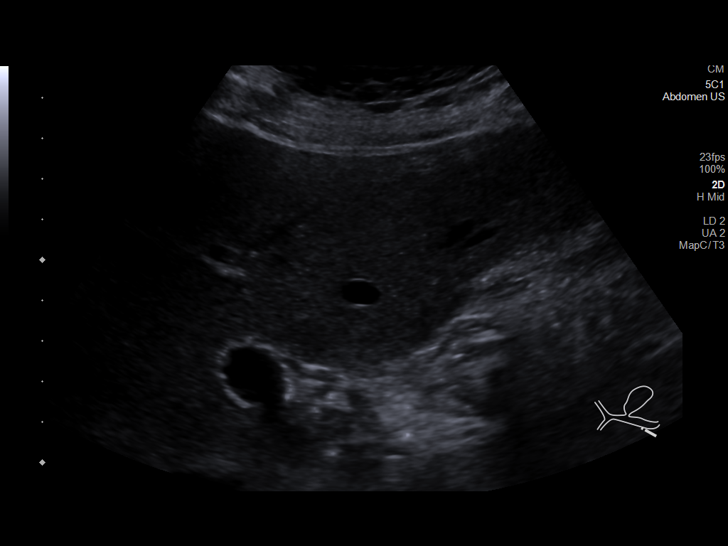
[im 30/48]
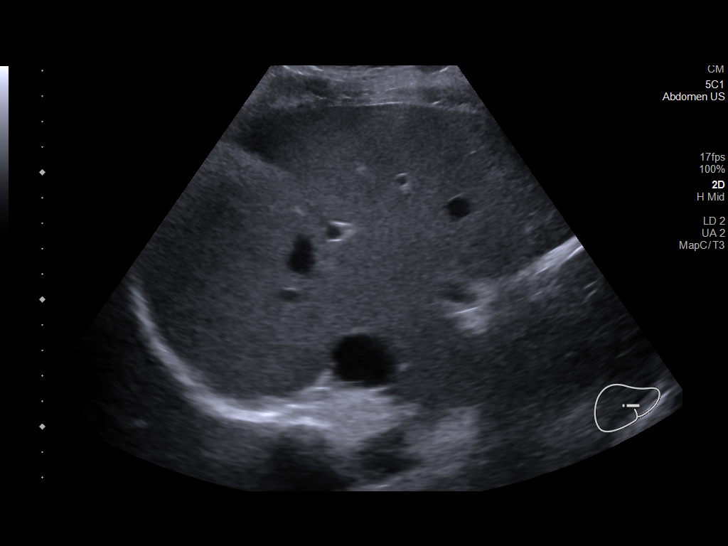
[im 32/48]
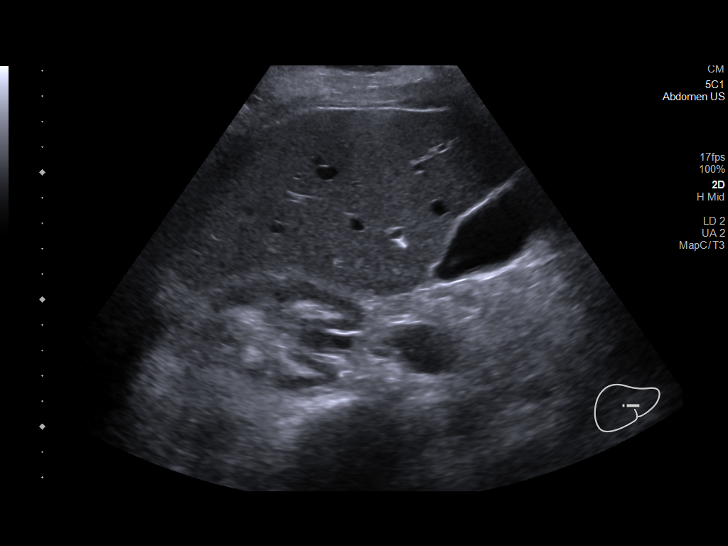
[im 36/48]
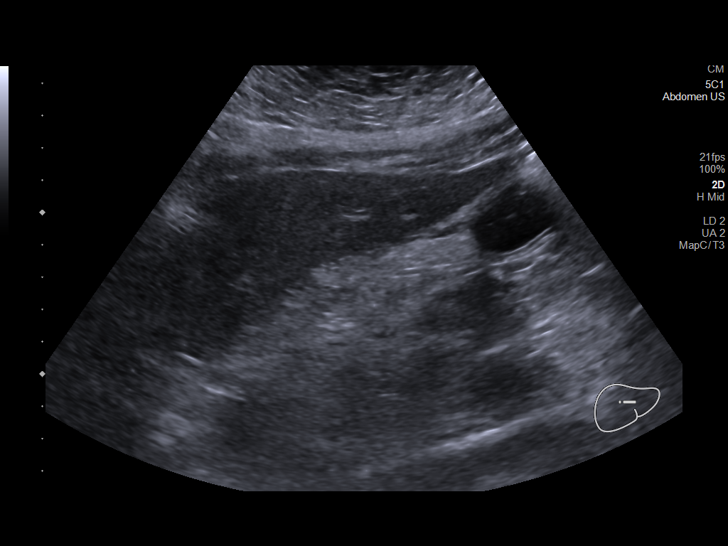
[im 40/48]
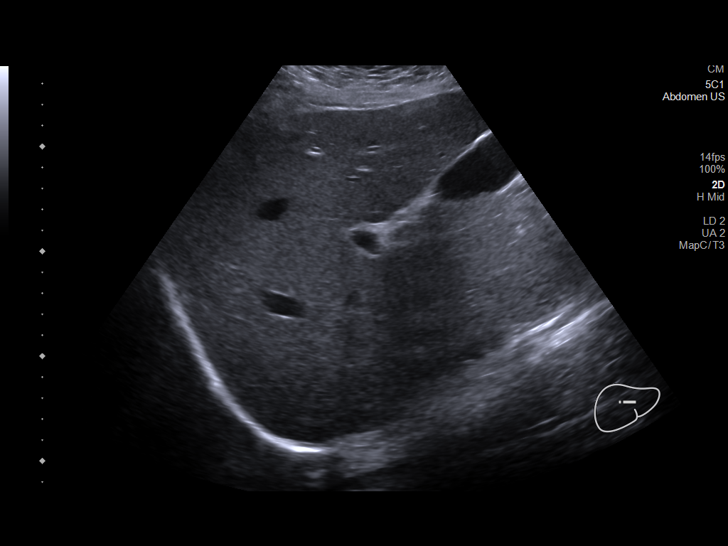
[im 44/48]
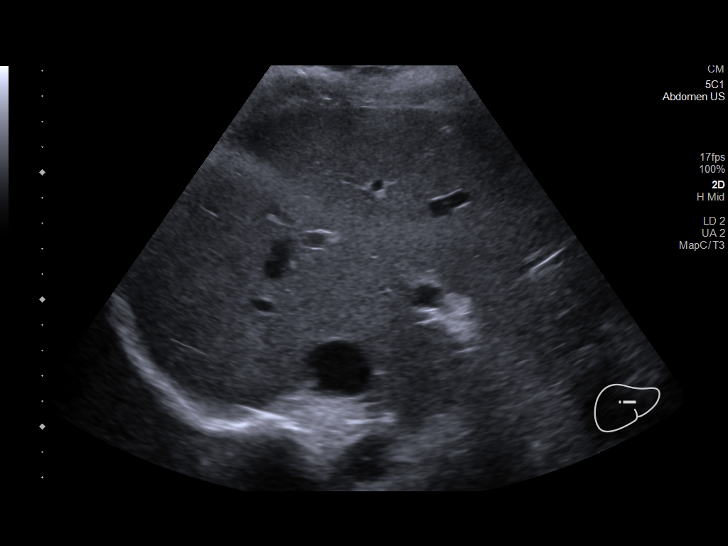
[im 48/48]
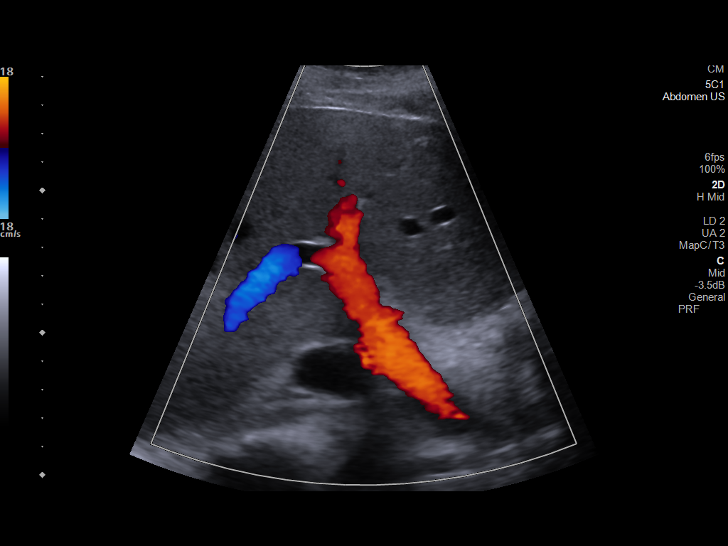

[14 of 25 positions shown; findings below may reference images not displayed]

FINDINGS: Gallbladder:

1 cm gallstone is noted. No gallbladder wall thickening or
pericholecystic fluid is noted. No sonographic Murphy's sign is
noted.

Common bile duct:

Diameter: 3 mm which is within normal limits.

Liver:

No focal lesion identified. Within normal limits in parenchymal
echogenicity. Portal vein is patent on color Doppler imaging with
normal direction of blood flow towards the liver.

Other: None.
IMPRESSION: Cholelithiasis without evidence of cholecystitis. No other
abnormality seen in the right upper quadrant of the abdomen.

## 2024-02-20 DIAGNOSIS — G894 Chronic pain syndrome: Secondary | ICD-10-CM | POA: Diagnosis not present

## 2024-02-20 DIAGNOSIS — F1490 Cocaine use, unspecified, uncomplicated: Secondary | ICD-10-CM | POA: Diagnosis not present

## 2024-02-20 DIAGNOSIS — F1120 Opioid dependence, uncomplicated: Secondary | ICD-10-CM | POA: Diagnosis not present

## 2024-02-20 DIAGNOSIS — Z3202 Encounter for pregnancy test, result negative: Secondary | ICD-10-CM | POA: Diagnosis not present

## 2024-02-24 DIAGNOSIS — G894 Chronic pain syndrome: Secondary | ICD-10-CM | POA: Diagnosis not present

## 2024-02-24 DIAGNOSIS — F419 Anxiety disorder, unspecified: Secondary | ICD-10-CM | POA: Diagnosis not present

## 2024-02-24 DIAGNOSIS — F1120 Opioid dependence, uncomplicated: Secondary | ICD-10-CM | POA: Diagnosis not present

## 2024-02-27 DIAGNOSIS — F1120 Opioid dependence, uncomplicated: Secondary | ICD-10-CM | POA: Diagnosis not present

## 2024-02-27 DIAGNOSIS — F1490 Cocaine use, unspecified, uncomplicated: Secondary | ICD-10-CM | POA: Diagnosis not present

## 2024-02-27 DIAGNOSIS — G894 Chronic pain syndrome: Secondary | ICD-10-CM | POA: Diagnosis not present

## 2024-02-27 DIAGNOSIS — F419 Anxiety disorder, unspecified: Secondary | ICD-10-CM | POA: Diagnosis not present

## 2024-05-15 ENCOUNTER — Encounter: Payer: Self-pay | Admitting: Advanced Practice Midwife
